# Patient Record
Sex: Male | Born: 1970 | Race: Black or African American | Hispanic: No | Marital: Married | Smoking: Current every day smoker
Health system: Southern US, Community
[De-identification: ages and names within clinical notes are randomized; demographics above are authoritative.]

## PROBLEM LIST (undated history)

## (undated) DIAGNOSIS — I10 Essential (primary) hypertension: Secondary | ICD-10-CM

## (undated) DIAGNOSIS — F1910 Other psychoactive substance abuse, uncomplicated: Secondary | ICD-10-CM

---

## 2014-03-27 ENCOUNTER — Encounter (HOSPITAL_COMMUNITY): Payer: Self-pay | Admitting: Emergency Medicine

## 2014-03-27 ENCOUNTER — Emergency Department (HOSPITAL_COMMUNITY)
Admission: EM | Admit: 2014-03-27 | Discharge: 2014-03-27 | Disposition: A | Discharge status: Home / Self Care | Payer: Self-pay | Attending: Emergency Medicine | Admitting: Emergency Medicine

## 2014-03-27 ENCOUNTER — Emergency Department (HOSPITAL_COMMUNITY): Payer: Self-pay

## 2014-03-27 DIAGNOSIS — F17200 Nicotine dependence, unspecified, uncomplicated: Secondary | ICD-10-CM | POA: Insufficient documentation

## 2014-03-27 DIAGNOSIS — Z23 Encounter for immunization: Secondary | ICD-10-CM | POA: Insufficient documentation

## 2014-03-27 DIAGNOSIS — S41109A Unspecified open wound of unspecified upper arm, initial encounter: Secondary | ICD-10-CM | POA: Insufficient documentation

## 2014-03-27 DIAGNOSIS — IMO0002 Reserved for concepts with insufficient information to code with codable children: Secondary | ICD-10-CM

## 2014-03-27 DIAGNOSIS — Y9389 Activity, other specified: Secondary | ICD-10-CM | POA: Insufficient documentation

## 2014-03-27 DIAGNOSIS — W138XXA Fall from, out of or through other building or structure, initial encounter: Secondary | ICD-10-CM | POA: Insufficient documentation

## 2014-03-27 DIAGNOSIS — Y92009 Unspecified place in unspecified non-institutional (private) residence as the place of occurrence of the external cause: Secondary | ICD-10-CM | POA: Insufficient documentation

## 2014-03-27 MED ORDER — HYDROCODONE-ACETAMINOPHEN 5-325 MG PO TABS: 1.0000 | ORAL_TABLET | ORAL | Status: DC | PRN

## 2014-03-27 MED ORDER — LIDOCAINE-EPINEPHRINE 2 %-1:100000 IJ SOLN
20.0000 mL | Freq: Once | INTRAMUSCULAR | Status: AC
  Administered 2014-03-27: 20 mL
  Filled 2014-03-27: qty 1

## 2014-03-27 MED ORDER — CEPHALEXIN 500 MG PO CAPS: 500.0000 mg | ORAL_CAPSULE | Freq: Four times a day (QID) | ORAL | Status: DC

## 2014-03-27 MED ORDER — TETANUS-DIPHTH-ACELL PERTUSSIS 5-2.5-18.5 LF-MCG/0.5 IM SUSP
0.5000 mL | Freq: Once | INTRAMUSCULAR | Status: AC
  Administered 2014-03-27: 0.5 mL via INTRAMUSCULAR
  Filled 2014-03-27: qty 0.5

## 2014-03-27 MED ORDER — MORPHINE SULFATE 4 MG/ML IJ SOLN
4.0000 mg | Freq: Once | INTRAMUSCULAR | Status: AC
  Administered 2014-03-27: 4 mg via INTRAMUSCULAR
  Filled 2014-03-27: qty 1

## 2014-03-27 MED ORDER — ONDANSETRON 4 MG PO TBDP
4.0000 mg | ORAL_TABLET | Freq: Once | ORAL | Status: AC
  Administered 2014-03-27: 4 mg via ORAL
  Filled 2014-03-27: qty 1

## 2014-03-27 NOTE — ED Notes (Addendum)
Lidocaine at bedside and suture cart

## 2014-03-27 NOTE — ED Provider Notes (Signed)
CSN: [id number]     Arrival date & time 03/27/14  0603 History   First MD Initiated Contact with Patient 03/27/14 0617     Chief Complaint  Patient presents with  . Extremity Laceration    right arm     (Consider location/radiation/quality/duration/timing/severity/associated sxs/prior Treatment) HPI  Patient to the ED with complaints of laceration to right upper arm. He reports the incident happened at 3am this morning after he fell off of his porch. He denies that he was intoxicated and denies that he was stabbed or assaulted. He denies hitting his head or injuring his neck. He presented a few hours late because he waited for his mother to get home, he thought she would be able to fix it but she did not have the materials to do it at home. He has no other complaints of pain or injuries. He has a small cut to his finger but reports not wanting it evaluated " I don't like hospitals". Wound is no longer bleeding  History reviewed. No pertinent past medical history. History reviewed. No pertinent past surgical history. No family history on file. History  Substance Use Topics  . Smoking status: Current Every Day Smoker -- 1.00 packs/day    Types: Cigarettes  . Smokeless tobacco: Not on file  . Alcohol Use: No    Review of Systems  Review of Systems  Gen: no weight loss, fevers, chills, night sweats  Eyes: no occular draining, occular pain,  No visual changes  Nose: no epistaxis or rhinorrhea  Mouth: no dental pain, no sore throat  Neck: no neck pain  Lungs: No hemoptysis. No wheezing or coughing CV:  No palpitations, dependent edema or orthopnea. No chest pain Abd: no diarrhea. No nausea or vomiting, No abdominal pain  GU: no dysuria or gross hematuria  MSK:  No muscle weakness, No muscular pain Neuro: no headache, no focal neurologic deficits  Skin: no rash , + wounds Psyche: no complaints of depression or anxiety   Allergies  Review of patient's allergies indicates no known  allergies.  Home Medications   Prior to Admission medications   Medication Sig Start Date End Date Taking? Authorizing Provider  cephALEXin (KEFLEX) 500 MG capsule Take 1 capsule (500 mg total) by mouth 4 (four) times daily. 03/27/14   Thekla Colborn Irine Seal, PA-C  HYDROcodone-acetaminophen (NORCO/VICODIN) 5-325 MG per tablet Take 1-2 tablets by mouth every 4 (four) hours as needed for moderate pain or severe pain. 03/27/14   Diondre Pulis Irine Seal, PA-C   BP 135/82  Pulse 75  Temp(Src) 97.8 F (36.6 C) (Oral)  Resp 18  Ht  (1.676 m)  Wt 160 lb (72.576 kg)  BMI 25.84 kg/m2  SpO2 100% Physical Exam  Nursing note and vitals reviewed. Constitutional: He appears well-developed and well-nourished. No distress.  HENT:  Head: Normocephalic and atraumatic.  Eyes: Pupils are equal, round, and reactive to light.  Neck: Normal range of motion. Neck supple.  Cardiovascular: Normal rate and regular rhythm.   Pulmonary/Chest: Effort normal.  Abdominal: Soft.  Musculoskeletal:  Large laceration to posterior tricep. 3 inch length, gapping.  Wound explored and does not extend into the fascia. It does extend through the entire subcutaneous layer of fat. He has full strength and sensation to all 5 fingers, wrist, elbow and shoulder joints. CR < 2 seconds. Mild tenderness to palpation.  Neurological: He is alert.  Skin: Skin is warm and dry.     ED Course  Procedures (including critical care  time) Labs Review Labs Reviewed - No data to display  Imaging Review Dg Humerus Right  03/27/2014   CLINICAL DATA:  Fall with laceration to the posterior right humerus.  EXAM: RIGHT HUMERUS - 2+ VIEW  COMPARISON:  None.  FINDINGS: Soft tissue defect demonstrated along the lateral mid right upper arm consistent with history laceration. No radiopaque soft tissue foreign bodies are demonstrated. The right humerus appears intact. No evidence of acute fracture or bone destruction.  IMPRESSION: Soft tissue laceration. No  radiopaque soft tissue foreign bodies. No acute bony abnormalities.   Electronically Signed   By: Burman Nieves M.D.   On: 03/27/2014 06:38     EKG Interpretation None      MDM   Final diagnoses:  Laceration   LACERATION REPAIR Performed by: Dorthula Matas Authorized by: Dorthula Matas Consent: Verbal consent obtained. Risks and benefits: risks, benefits and alternatives were discussed Consent given by: patient Patient identity confirmed: provided demographic data Prepped and Draped in normal sterile fashion Wound explored  Laceration Location: right upper extremity  Laceration Length: 3 inches  No Foreign Bodies seen or palpated  Anesthesia: local infiltration  Local anesthetic: lidocaine 2% with epinephrine  Anesthetic total: 6 ml  Irrigation method: syringe Amount of cleaning: standard  Skin closure: subq ; staples  Number of sutures: 3 ; 8  Technique: horizontal mattress ; staples  Patient tolerance: Patient tolerated the procedure well with no immediate complications.  Pt given tetanus shot in the ED, pain medication and started on Keflex. Advised to return to the ED in or Urgent Care in 7-10 days for staple removal..  Pt again declines assault, stabbing, intoxication, head pain, loc, neck pain or any other injuries or pain. A second time declines his finger being evaluated. Mom is here and fine with his decision. He does not appear intoxicated and to be thinking clearly.  43 y.o.Ricardo Hale's evaluation in the Emergency Department is complete. It has been determined that no acute conditions requiring further emergency intervention are present at this time. The patient/guardian have been advised of the diagnosis and plan. We have discussed signs and symptoms that warrant return to the ED, such as changes or worsening in symptoms.  Vital signs are stable at discharge. Filed Vitals:   03/27/14 0653  BP: 135/82  Pulse: 75  Temp: 97.8 F (36.6 C)   Resp: 18    Patient/guardian has voiced understanding and agreed to follow-up with the PCP or specialist.    Dorthula Matas, PA-C 03/27/14 1610

## 2014-03-27 NOTE — ED Notes (Signed)
PA at bedside.

## 2014-03-27 NOTE — ED Provider Notes (Signed)
Medical screening examination/treatment/procedure(s) were performed by non-physician practitioner and as supervising physician I was immediately available for consultation/collaboration.   EKG Interpretation None       Rani Sisney K Tavian Callander-Rasch, MD 03/27/14 [phone]

## 2014-03-27 NOTE — ED Notes (Addendum)
Patient reports he fell from his balcony at home and cut his arm. Patient is unsure what he cut his arm on. Patient has open wound to right upper arm, @ 2.5"-3" in length. Patient believe his tetanus shot was > 5 years ago. Patient states he cleaned the wound with peroxide and applied neosporin.

## 2014-03-27 NOTE — Discharge Instructions (Signed)

## 2017-08-27 ENCOUNTER — Emergency Department (HOSPITAL_COMMUNITY)
Admission: EM | Admit: 2017-08-27 | Discharge: 2017-08-27 | Disposition: A | Discharge status: Home / Self Care | Payer: Self-pay | Attending: Emergency Medicine | Admitting: Emergency Medicine

## 2017-08-27 ENCOUNTER — Other Ambulatory Visit: Payer: Self-pay

## 2017-08-27 ENCOUNTER — Encounter (HOSPITAL_COMMUNITY): Payer: Self-pay | Admitting: Emergency Medicine

## 2017-08-27 DIAGNOSIS — F1910 Other psychoactive substance abuse, uncomplicated: Secondary | ICD-10-CM | POA: Insufficient documentation

## 2017-08-27 DIAGNOSIS — F17210 Nicotine dependence, cigarettes, uncomplicated: Secondary | ICD-10-CM | POA: Insufficient documentation

## 2017-08-27 DIAGNOSIS — T50901A Poisoning by unspecified drugs, medicaments and biological substances, accidental (unintentional), initial encounter: Secondary | ICD-10-CM | POA: Insufficient documentation

## 2017-08-27 LAB — CBC
HCT: 42.9 % (ref 39.0–52.0)
HEMOGLOBIN: 14.4 g/dL (ref 13.0–17.0)
MCH: 30 pg (ref 26.0–34.0)
MCHC: 33.6 g/dL (ref 30.0–36.0)
MCV: 89.4 fL (ref 78.0–100.0)
Platelets: 292 10*3/uL (ref 150–400)
RBC: 4.8 MIL/uL (ref 4.22–5.81)
RDW: 13.9 % (ref 11.5–15.5)
WBC: 16.8 10*3/uL — ABNORMAL HIGH (ref 4.0–10.5)

## 2017-08-27 LAB — COMPREHENSIVE METABOLIC PANEL
ALK PHOS: 63 U/L (ref 38–126)
ALT: 17 U/L (ref 17–63)
ANION GAP: 7 (ref 5–15)
AST: 23 U/L (ref 15–41)
Albumin: 4.2 g/dL (ref 3.5–5.0)
BUN: 14 mg/dL (ref 6–20)
CALCIUM: 9.1 mg/dL (ref 8.9–10.3)
CO2: 28 mmol/L (ref 22–32)
Chloride: 107 mmol/L (ref 101–111)
Creatinine, Ser: 1.34 mg/dL — ABNORMAL HIGH (ref 0.61–1.24)
GFR calc non Af Amer: >60 mL/min (ref >60)
Glucose, Bld: 113 mg/dL — ABNORMAL HIGH (ref 65–99)
Potassium: 4.1 mmol/L (ref 3.5–5.1)
SODIUM: 142 mmol/L (ref 135–145)
Total Bilirubin: 0.6 mg/dL (ref 0.3–1.2)
Total Protein: 6.8 g/dL (ref 6.5–8.1)

## 2017-08-27 LAB — CBG MONITORING, ED: Glucose-Capillary: 87 mg/dL (ref 65–99)

## 2017-08-27 LAB — RAPID URINE DRUG SCREEN, HOSP PERFORMED
Amphetamines: NOT DETECTED
Barbiturates: NOT DETECTED
Benzodiazepines: POSITIVE — AB
COCAINE: NOT DETECTED
Opiates: POSITIVE — AB
Tetrahydrocannabinol: NOT DETECTED

## 2017-08-27 LAB — ETHANOL: Alcohol, Ethyl (B): <10 mg/dL (ref <10)

## 2017-08-27 LAB — ACETAMINOPHEN LEVEL

## 2017-08-27 LAB — SALICYLATE LEVEL: Salicylate Lvl: <7 mg/dL (ref 2.8–30.0)

## 2017-08-27 NOTE — ED Triage Notes (Signed)
Per EMS pt returned from getting cigarettes around 1600; significant other then found him unresponsive. Pt hx of heroin use. Pt given 2 mg of narcan IM by significant other and additional 2 mg of narcan IV with EMS. Pt has NPA in place and 15 lpm [place] [facility].

## 2017-08-27 NOTE — ED Notes (Signed)
Patient declined discharge papers. Patient given paper scrub top, socks and sandals.

## 2017-08-27 NOTE — ED Notes (Signed)
Patient found attempting to leave ER with IV in hand. Patient redirected to room. EDP made aware, will come to bedside.

## 2017-08-27 NOTE — Progress Notes (Signed)
In and out 14 FR cath to obtain urine from unresponsive patient.  amber urine returned. Pt tolerated without issue.

## 2017-08-27 NOTE — ED Notes (Signed)
Patient is sleeping and and when trying to wake patient to obtain urine sample patient stated that he is fine and wants to leave. Then patient proceeds to fall back alseep before this tech can explain we need urine sample.

## 2017-08-27 NOTE — ED Notes (Signed)
EDP at bedside  

## 2017-08-27 NOTE — ED Provider Notes (Signed)
[facility]-EMERGENCY DEPT Provider Note   CSN: [id number] Arrival date & time: 08/27/17  1648     History   Chief Complaint Chief Complaint  Patient presents with  . Unresponsive    HPI Ricardo Hale is a 47 y.o. male.  47 yo M with a chief complaint of being found unresponsive.  Patient told me that he took quite a few Xanax tablets.  He did this earlier today.  Denied suicidality.  He denied any narcotic use.  Denied any other illegal drug use.  Denies alcohol use.  Patient had significant improvement with Narcan in route to the hospital.  He denies recent illness.  Denies chest pain or shortness of breath.  Denies abdominal pain nausea vomiting or diarrhea.   The history is provided by the patient.  Illness  This is a new problem. The current episode started 3 to 5 hours ago. The problem occurs constantly. The problem has not changed since onset.Pertinent negatives include no chest pain, no abdominal pain, no headaches and no shortness of breath. Nothing aggravates the symptoms. Nothing relieves the symptoms. He has tried nothing for the symptoms. The treatment provided no relief.    History reviewed. No pertinent past medical history.  There are no active problems to display for this patient.   History reviewed. No pertinent surgical history.     Home Medications    Prior to Admission medications   Medication Sig Start Date End Date Taking? Authorizing Provider  cephALEXin (KEFLEX) 500 MG capsule Take 1 capsule (500 mg total) by mouth 4 (four) times daily. 03/27/14   Marlon Pel, PA-C  HYDROcodone-acetaminophen (NORCO/VICODIN) 5-325 MG per tablet Take 1-2 tablets by mouth every 4 (four) hours as needed for moderate pain or severe pain. 03/27/14   Marlon Pel, PA-C    Family History No family history on file.  Social History Social History   Tobacco Use  . Smoking status: Current Every Day Smoker    Packs/day: 1.00    Types:  Cigarettes  Substance Use Topics  . Alcohol use: No  . Drug use: No     Allergies   Patient has no known allergies.   Review of Systems Review of Systems  Constitutional: Negative for chills and fever.  HENT: Negative for congestion and facial swelling.   Eyes: Negative for discharge and visual disturbance.  Respiratory: Negative for shortness of breath.   Cardiovascular: Negative for chest pain and palpitations.  Gastrointestinal: Negative for abdominal pain, diarrhea and vomiting.  Musculoskeletal: Negative for arthralgias and myalgias.  Skin: Negative for color change and rash.  Neurological: Negative for tremors, syncope and headaches.  Psychiatric/Behavioral: Negative for confusion and dysphoric mood.     Physical Exam Updated Vital Signs BP 123/90   Pulse 86   Temp 98.4 F (36.9 C) (Oral)   Resp 17   SpO2 98%   Physical Exam  Constitutional: He is oriented to person, place, and time. He appears well-developed and well-nourished.  HENT:  Head: Normocephalic and atraumatic.  Nasal trumpet in the right naris  Eyes: EOM are normal. Pupils are equal, round, and reactive to light.  Neck: Normal range of motion. Neck supple. No JVD present.  Cardiovascular: Normal rate and regular rhythm. Exam reveals no gallop and no friction rub.  No murmur heard. Pulmonary/Chest: No respiratory distress. He has no wheezes.  Abdominal: He exhibits no distension and no mass. There is no tenderness. There is no rebound and no guarding.  Musculoskeletal: Normal range  of motion.  Neurological: He is alert and oriented to person, place, and time.  Skin: No rash noted. No pallor.  Psychiatric: He has a normal mood and affect. His behavior is normal.  Nursing note and vitals reviewed.    ED Treatments / Results  Labs (all labs ordered are listed, but only abnormal results are displayed) Labs Reviewed  COMPREHENSIVE METABOLIC PANEL - Abnormal; Notable for the following components:        Result Value   Glucose, Bld 113 (*)    Creatinine, Ser 1.34 (*)    All other components within normal limits  ACETAMINOPHEN LEVEL - Abnormal; Notable for the following components:   Acetaminophen (Tylenol), Serum <10 (*)    All other components within normal limits  CBC - Abnormal; Notable for the following components:   WBC 16.8 (*)    All other components within normal limits  RAPID URINE DRUG SCREEN, HOSP PERFORMED - Abnormal; Notable for the following components:   Opiates POSITIVE (*)    Benzodiazepines POSITIVE (*)    All other components within normal limits  ETHANOL  SALICYLATE LEVEL  CBG MONITORING, ED    EKG  EKG Interpretation  Date/Time:  Thursday August 27 2017 17:05:13 EST Ventricular Rate:  85 PR Interval:    QRS Duration: 90 QT Interval:  350 QTC Calculation: 417 R Axis:   25 Text Interpretation:  Sinus rhythm No old tracing to compare Confirmed by Melene PlanFloyd, Ozzie Remmers [phone]([id number]) on 08/27/2017 5:22:10 PM       Radiology No results found.  Procedures Procedures (including critical care time)  Medications Ordered in ED Medications - No data to display   Initial Impression / Assessment and Plan / ED Course  I have reviewed the triage vital signs and the nursing notes.  Pertinent labs & imaging results that were available during my care of the patient were reviewed by me and considered in my medical decision making (see chart for details).     47 yo M with an unintentional overdose.  Will observe in the emergency department.  Patient is now ambulating independently.  He denies suicidal or homicidal ideation.  Patient is requesting discharge home. Given outpatient resources.  10:26 PM:  I have discussed the diagnosis/risks/treatment options with the patient and believe the pt to be eligible for discharge home to follow-up with PCP. We also discussed returning to the ED immediately if new or worsening sx occur. We discussed the sx which are most concerning  (e.g., sudden worsening pain, fever, inability to tolerate by mouth) that necessitate immediate return. Medications administered to the patient during their visit and any new prescriptions provided to the patient are listed below.  Medications given during this visit Medications - No data to display   The patient appears reasonably screen and/or stabilized for discharge and I doubt any other medical condition or other [facility] requiring further screening, evaluation, or treatment in the ED at this time prior to discharge.    Final Clinical Impressions(s) / ED Diagnoses   Final diagnoses:  Accidental drug overdose, initial encounter  Substance abuse [facility](HCC)    ED Discharge Orders    None       Melene PlanFloyd, Sobia Karger, DO 08/27/17 2227

## 2017-08-29 ENCOUNTER — Emergency Department (HOSPITAL_COMMUNITY): Payer: Self-pay

## 2017-08-29 ENCOUNTER — Inpatient Hospital Stay (HOSPITAL_COMMUNITY)
Admission: EM | Admit: 2017-08-29 | Discharge: 2017-08-30 | DRG: 917 | Discharge status: Against Medical Advice | Payer: Self-pay | Attending: Pulmonary Disease | Admitting: Pulmonary Disease

## 2017-08-29 ENCOUNTER — Encounter (HOSPITAL_COMMUNITY): Payer: Self-pay | Admitting: Emergency Medicine

## 2017-08-29 DIAGNOSIS — J690 Pneumonitis due to inhalation of food and vomit: Secondary | ICD-10-CM | POA: Diagnosis present

## 2017-08-29 DIAGNOSIS — J9601 Acute respiratory failure with hypoxia: Secondary | ICD-10-CM

## 2017-08-29 DIAGNOSIS — R092 Respiratory arrest: Secondary | ICD-10-CM | POA: Diagnosis present

## 2017-08-29 DIAGNOSIS — R911 Solitary pulmonary nodule: Secondary | ICD-10-CM | POA: Diagnosis present

## 2017-08-29 DIAGNOSIS — N179 Acute kidney failure, unspecified: Secondary | ICD-10-CM | POA: Diagnosis present

## 2017-08-29 DIAGNOSIS — R0902 Hypoxemia: Secondary | ICD-10-CM | POA: Diagnosis present

## 2017-08-29 DIAGNOSIS — F17210 Nicotine dependence, cigarettes, uncomplicated: Secondary | ICD-10-CM | POA: Diagnosis present

## 2017-08-29 DIAGNOSIS — T401X1A Poisoning by heroin, accidental (unintentional), initial encounter: Principal | ICD-10-CM | POA: Diagnosis present

## 2017-08-29 LAB — COMPREHENSIVE METABOLIC PANEL
ALT: 24 U/L (ref 17–63)
ANION GAP: 7 (ref 5–15)
AST: 50 U/L — ABNORMAL HIGH (ref 15–41)
Albumin: 4.2 g/dL (ref 3.5–5.0)
Alkaline Phosphatase: 55 U/L (ref 38–126)
BUN: 20 mg/dL (ref 6–20)
CALCIUM: 9.1 mg/dL (ref 8.9–10.3)
CHLORIDE: 109 mmol/L (ref 101–111)
CO2: 27 mmol/L (ref 22–32)
Creatinine, Ser: 1.12 mg/dL (ref 0.61–1.24)
Glucose, Bld: 97 mg/dL (ref 65–99)
Potassium: 3.9 mmol/L (ref 3.5–5.1)
Sodium: 143 mmol/L (ref 135–145)
Total Bilirubin: 1 mg/dL (ref 0.3–1.2)
Total Protein: 6.8 g/dL (ref 6.5–8.1)

## 2017-08-29 LAB — CBC WITH DIFFERENTIAL/PLATELET
Basophils Absolute: 0.1 10*3/uL (ref 0.0–0.1)
Basophils Relative: 0 %
EOS ABS: 0.6 10*3/uL (ref 0.0–0.7)
EOS PCT: 5 %
HCT: 38.5 % — ABNORMAL LOW (ref 39.0–52.0)
Hemoglobin: 13.1 g/dL (ref 13.0–17.0)
LYMPHS ABS: 2.7 10*3/uL (ref 0.7–4.0)
LYMPHS PCT: 21 %
MCH: 30 pg (ref 26.0–34.0)
MCHC: 34 g/dL (ref 30.0–36.0)
MCV: 88.3 fL (ref 78.0–100.0)
MONO ABS: 1 10*3/uL (ref 0.1–1.0)
MONOS PCT: 8 %
Neutro Abs: 8.3 10*3/uL — ABNORMAL HIGH (ref 1.7–7.7)
Neutrophils Relative %: 66 %
PLATELETS: 292 10*3/uL (ref 150–400)
RBC: 4.36 MIL/uL (ref 4.22–5.81)
RDW: 14.1 % (ref 11.5–15.5)
WBC: 12.6 10*3/uL — ABNORMAL HIGH (ref 4.0–10.5)

## 2017-08-29 LAB — BLOOD GAS, ARTERIAL
ACID-BASE EXCESS: 0.6 mmol/L (ref 0.0–2.0)
Bicarbonate: 25.9 mmol/L (ref 20.0–28.0)
Drawn by: 422461
O2 Content: 2 L/min
O2 Saturation: 96.3 %
PH ART: 7.364 (ref 7.350–7.450)
Patient temperature: 98.1
pCO2 arterial: 46.4 mmHg (ref 32.0–48.0)
pO2, Arterial: 92.2 mmHg (ref 83.0–108.0)

## 2017-08-29 LAB — ETHANOL

## 2017-08-29 LAB — I-STAT CG4 LACTIC ACID, ED
LACTIC ACID, VENOUS: 0.41 mmol/L — AB (ref 0.5–1.9)
Lactic Acid, Venous: 0.69 mmol/L (ref 0.5–1.9)

## 2017-08-29 LAB — I-STAT TROPONIN, ED: Troponin i, poc: 0 ng/mL (ref 0.00–0.08)

## 2017-08-29 LAB — I-STAT CHEM 8, ED
BUN: 19 mg/dL (ref 6–20)
CHLORIDE: 104 mmol/L (ref 101–111)
Calcium, Ion: 1.19 mmol/L (ref 1.15–1.40)
Creatinine, Ser: 1.1 mg/dL (ref 0.61–1.24)
Glucose, Bld: 90 mg/dL (ref 65–99)
HEMATOCRIT: 40 % (ref 39.0–52.0)
Hemoglobin: 13.6 g/dL (ref 13.0–17.0)
Potassium: 3.7 mmol/L (ref 3.5–5.1)
Sodium: 143 mmol/L (ref 135–145)
TCO2: 26 mmol/L (ref 22–32)

## 2017-08-29 MED ORDER — SODIUM CHLORIDE 0.9 % IV SOLN: 250.0000 mL | INTRAVENOUS | Status: DC | PRN

## 2017-08-29 MED ORDER — PANTOPRAZOLE SODIUM 40 MG PO TBEC: 40.0000 mg | DELAYED_RELEASE_TABLET | Freq: Every day | ORAL | Status: DC

## 2017-08-29 MED ORDER — PIPERACILLIN-TAZOBACTAM 3.375 G IVPB 30 MIN
3.3750 g | Freq: Once | INTRAVENOUS | Status: AC
  Administered 2017-08-29: 3.375 g via INTRAVENOUS
  Filled 2017-08-29: qty 50

## 2017-08-29 MED ORDER — SODIUM CHLORIDE 0.9 % IV SOLN
1.0000 g | Freq: Once | INTRAVENOUS | Status: AC
  Administered 2017-08-29: 1 g via INTRAVENOUS
  Filled 2017-08-29: qty 10

## 2017-08-29 MED ORDER — NALOXONE HCL 0.4 MG/ML IJ SOLN
0.4000 mg | Freq: Once | INTRAMUSCULAR | Status: AC
  Administered 2017-08-29: 0.4 mg via INTRAVENOUS
  Filled 2017-08-29: qty 1

## 2017-08-29 MED ORDER — HEPARIN SODIUM (PORCINE) 5000 UNIT/ML IJ SOLN: 5000.0000 [IU] | Freq: Three times a day (TID) | INTRAMUSCULAR | Status: DC

## 2017-08-29 MED ORDER — IPRATROPIUM-ALBUTEROL 0.5-2.5 (3) MG/3ML IN SOLN: 3.0000 mL | Freq: Four times a day (QID) | RESPIRATORY_TRACT | Status: DC

## 2017-08-29 MED ORDER — NALOXONE HCL 4 MG/10ML IJ SOLN
0.2500 mg/h | INTRAMUSCULAR | Status: DC
  Administered 2017-08-29: 0.25 mg/h via INTRAVENOUS
  Filled 2017-08-29: qty 10

## 2017-08-29 MED ORDER — NALOXONE HCL 0.4 MG/ML IJ SOLN
INTRAMUSCULAR | Status: AC
  Filled 2017-08-29: qty 1

## 2017-08-29 MED ORDER — AZITHROMYCIN 500 MG IV SOLR
500.0000 mg | Freq: Once | INTRAVENOUS | Status: AC
  Administered 2017-08-29: 500 mg via INTRAVENOUS
  Filled 2017-08-29: qty 500

## 2017-08-29 MED ORDER — NALOXONE HCL 0.4 MG/ML IJ SOLN
0.2000 mg | Freq: Once | INTRAMUSCULAR | Status: AC
  Administered 2017-08-29: 0.2 mg via INTRAVENOUS

## 2017-08-29 MED ORDER — NALOXONE HCL 0.4 MG/ML IJ SOLN
0.1000 mg | Freq: Once | INTRAMUSCULAR | Status: AC
  Administered 2017-08-29: 0.1 mg via INTRAVENOUS
  Filled 2017-08-29: qty 1

## 2017-08-29 NOTE — ED Notes (Signed)
Patient noted to be apneic, snoring respirations with O2 86% on 2L [place]. EDP made aware. Verbal order to raise O2 to 4L and meds to be ordered.

## 2017-08-29 NOTE — H&P (Signed)
HISTORY & PHYSICAL  Patient Name: Ricardo Hale MRN: [id number] DOB: January 29, 1971    ADMISSION DATE:  08/29/2017 DATE OF SERVICE: 08/29/2017  REFERRING MD: Dr. Tilden FossaElizabeth Rees  CHIEF COMPLAINT:  Heroin overdose   HISTORY OF PRESENT ILLNESS  This 47 y.o. African-American male smoker [facility] via EMS with complaints of altered mental status and respiratory arrest.  According to available documentation, the patient collapsed in the presence of police officers, who will responding to a call of domestic dispute.  The patient was observed to be walking around and agitated but suddenly experienced respiratory arrest.  When EMS arrived, the patient was observed to demonstrate minimal respiratory effort.  He responded promptly to Narcan 1 mg single dose IV.  The patient admits to using heroin regularly.  In the emergency department, the patient has been managed with Narcan infusion.  At the time of clinical interview, the patient is easily arousable.  He is awake, alert and oriented to person and place.  He thinks it is 2017 but identifies "[name]" as POTUS.   REVIEW OF SYSTEMS Constitutional: No weight loss. No night sweats. No fever. No chills. No fatigue. HEENT: No headaches, dysphagia, sore throat, otalgia, nasal congestion, PND. CV:  No chest pain, orthopnea, PND, swelling in lower extremities, palpitations GI:  No abdominal pain, nausea, vomiting, diarrhea, change in bowel pattern, anorexia Resp: No DOE, rest dyspnea, cough, mucus, hemoptysis, wheezing  GU: no dysuria, change in color of urine, no urgency or frequency.  No flank pain. MS:  No joint pain or swelling. No myalgias,  No decreased range of motion.  Psych:  No change in mood or affect. No memory loss. Skin: no rash or lesions.   PAST MEDICAL/SURGICAL/SOCIAL/FAMILY HISTORIES   History reviewed. No pertinent past medical history.  History reviewed. No pertinent  surgical history.  Social History   Tobacco Use  . Smoking status: Current Every Day Smoker    Packs/day: 1.00    Types: Cigarettes  . Smokeless tobacco: Never Used  Substance Use Topics  . Alcohol use: No    No family history on file.   No Known Allergies   Prior to Admission medications   Medication Sig Start Date End Date Taking? Authorizing Provider  cephALEXin (KEFLEX) 500 MG capsule Take 1 capsule (500 mg total) by mouth 4 (four) times daily. 03/27/14   Marlon PelGreene, Tiffany, PA-C  HYDROcodone-acetaminophen (NORCO/VICODIN) 5-325 MG per tablet Take 1-2 tablets by mouth every 4 (four) hours as needed for moderate pain or severe pain. 03/27/14   Marlon PelGreene, Tiffany, PA-C    Current Facility-Administered Medications  Medication Dose Route Frequency Provider Last Rate Last Dose  . naloxone HCl (NARCAN) 4 mg in dextrose 5 % 250 mL infusion  0.25 mg/hr Intravenous Continuous Tilden Fossaees, Elizabeth, MD 15.6 mL/hr at 08/29/17 2034 0.25 mg/hr at 08/29/17 2034   Current Outpatient Medications  Medication Sig Dispense Refill  . cephALEXin (KEFLEX) 500 MG capsule Take 1 capsule (500 mg total) by mouth 4 (four) times daily. 28 capsule 0  . HYDROcodone-acetaminophen (NORCO/VICODIN) 5-325 MG per tablet Take 1-2 tablets by mouth every 4 (four) hours as needed for moderate pain or severe pain. 12 tablet 0    VITAL SIGNS: BP 111/88   Pulse 83   Temp 98.1 F (36.7 C) (Oral)   Resp 13   SpO2 98%   INTAKE / OUTPUT: No intake/output data recorded.  PHYSICAL EXAMINATION: GENERAL: Lethargic, easily arousable.  Oriented to  person and place.  No acute distress.  On room air.  SpO2 96% with nasal cannula running into the pillow. HEAD: normocephalic, atraumatic EYE: PERRLA, EOM intact, no scleral icterus, no pallor. NOSE: nares are patent. No polyps. No exudate. No sinus tenderness. THROAT/ORAL CAVITY: Normal dentition. No oral thrush. No exudate. Mucous membranes are moist. No tonsillar enlargement.   Mallampati class III airway. NECK: supple, no thyromegaly, no JVD, no lymphadenopathy. Trachea midline. CHEST/LUNG: symmetric in development and expansion. Good air entry.  Scattered rales and rhonchi. HEART: Regular S1 and S2 without murmur, rub or gallop. ABDOMEN: soft, nontender, nondistended. Normoactive bowel sounds. No rebound. No guarding. No hepatosplenomegaly. EXTREMITIES: Edema: Right hand edema, where his Narcan drip is infusing.  Otherwise no edema at the extremities. No cyanosis.  No clubbing. 2+ DP pulses LYMPHATIC: no cervical/axiallary/inguinal lymph nodes appreciated MUSCULOSKELETAL: no joint tenderness, deformity or swelling. SKIN: Multiple tattoos.  No rash or lesion. NEUROLOGIC: Doll's eyes intact. Corneal reflex intact. Spontaneous respirations intact. Cranial nerves II-XII are grossly symmetric and physiologic. Babinski absent. No sensory deficit. Motor: 5/5 @ RUE, 5/5 @ LUE, 5/5 @ RLL,  5/5 @ LLL.  DTR: 2+ @ R biceps, 2+ @ L biceps, 2+ @ R patellar,  2+ @ L patellar. No cerebellar signs. Gait was not assessed.   LABS:  BASIC METABOLIC PROFILE Recent Labs  Lab 08/27/17 1707 08/29/17 1846 08/29/17 1909  NA 142 143 143  K 4.1 3.7 3.9  CL 107 104 109  CO2 28  --  27  BUN 14 19 20   CREATININE 1.34* 1.10 1.12  GLUCOSE 113* 90 97  CALCIUM 9.1  --  9.1    Glucose Recent Labs  Lab 08/27/17 1716  GLUCAP 87    Liver Enzymes Recent Labs  Lab 08/27/17 1707 08/29/17 1909  AST 23 50*  ALT 17 24  ALKPHOS 63 55  BILITOT 0.6 1.0  ALBUMIN 4.2 4.2    CBC Recent Labs  Lab 08/27/17 1707 08/29/17 1846 08/29/17 1909  WBC 16.8*  --  12.6*  HGB 14.4 13.6 13.1  HCT 42.9 40.0 38.5*  PLT 292  --  292    COAGULATION STUDIES No results for input(s): APTT, INR in the last 168 hours.  SEPSIS MARKERS Recent Labs  Lab 08/29/17 1925 08/29/17 2102  LATICACIDVEN 0.69 0.41*    ABG No results for input(s): PHART, PCO2ART, PO2ART in the last 168  hours.  Cardiac Enzymes No results for input(s): TROPONINI, PROBNP in the last 168 hours.  Imaging Dg Chest Port 1 View  Result Date: 08/29/2017 CLINICAL DATA:  Patient with recent drug overdose. EXAM: PORTABLE CHEST 1 VIEW COMPARISON:  None. FINDINGS: Monitoring leads overlie the patient. Cardiac contours upper limits of normal. Low lung volumes. Consolidative opacities throughout the left lung. Nodular versus consolidative opacity right upper lung. No pleural effusion or pneumothorax. IMPRESSION: Heterogeneous opacities left lung may represent asymmetric pulmonary edema or atypical infection. Nodular consolidative opacity right upper hemithorax may represent pulmonary nodule versus infection. Recommend follow-up chest radiograph in 4-6 weeks if not performed earlier. If findings persist, chest CT would be warranted. Electronically Signed   By: Annia Belt M.D.   On: 08/29/2017 19:00   CULTURES: No results found for this or any previous visit.  ANTIBIOTICS: Rocephin/azithromycin (single doses, 08/29/2017) Zosyn (3/2-)   ASSESSMENT / PLAN: Principal Problem:   Respiratory arrest (HCC) Active Problems:   Heroin overdose (HCC)   Aspiration pneumonia (HCC)   Pulmonary nodule   Acute kidney  injury [facility])   Heroin overdose, accidental or unintentional, initial encounter (HCC)   By systems: PULMONARY  Abnormal chest x-ray  Aspiration pneumonia  Pulmonary nodule, right upper lobe Check ABG in 1 hour. Check procalcitonin. Empiric Zosyn. Repeat chest x-ray in 6 weeks.  Persistence of the right upper lobe abnormality would warrant chest CT imaging.  RENAL  Acute kidney injury IV fluids  NEUROLOGIC  Heroin overdose Narcan gtt.  Titrate for RASS 0 to -1.  Admit to ICU under my service (Attending: Marcelle Smiling, MD) with the diagnoses highlighted above in the active Hospital Problem List (ASSESSMENT). IV fluids: Normal saline @75  cc/h.  NUTRITION: Regular diet DVT  PROPHYLAXIS: Heparin GI PROPHYLAXIS: Protonix  FAMILY  - Updates: No family at the bedside   Marcelle Smiling, MD Board Certified by the ABIM, Pulmonary Diseases & Critical Care Medicine  [facility] Pager: [phone]  08/29/2017, 10:23 PM

## 2017-08-29 NOTE — ED Triage Notes (Signed)
[employer] called d/t pt being in a domestic dispute. Pt was found walking around agitated. [employer] sts that pt just collapsed. When EMS arrived, pt was apneic with minimal respiratory effort. Pt was given 1 mg Narcan IV. Pt became responsive within 1 minute. Pt was alert and oriented en route. Pt reported to EMS that he snorted "a line of heroin." Pt sts he believes that it was "laced with something."

## 2017-08-29 NOTE — Progress Notes (Signed)
A consult was received from an ED physician for zosyn per pharmacy dosing.  The patient's profile has been reviewed for ht/wt/allergies/indication/available labs.   A one time order has been placed for zosyn 3.375gm iv x1.  Further antibiotics/pharmacy consults should be ordered by admitting physician if indicated.                       Thank you, Ricardo DavidsonGrimsley Hale, Ricardo Hale 08/29/2017  10:45 PM

## 2017-08-29 NOTE — ED Provider Notes (Signed)
[facility]-EMERGENCY DEPT Provider Note   CSN: [id number] Arrival date & time: 08/29/17  1805     History   Chief Complaint Chief Complaint  Patient presents with  . Drug Overdose    HPI Ricardo Hale is a 47 y.o. male.  The history is provided by the patient and the EMS personnel. No language interpreter was used.  Drug Overdose     Ricardo Hale is a 47 y.o. male who presents to the Emergency Department complaining of overdose.  Level V caveat due to AMS.  Per report [employer] was called for domestic dispute and patient was walking around agitated and then collapsed and apneic.  He was given 1 mg of Narcan IV and became responsive.  Patient states that he snorted a line of heroin and thinks it may have been laced with something.  He reports using heroin 3-5 times a month.  He has no current complaints.  He denies any SI, HI. History reviewed. No pertinent past medical history.  Patient Active Problem List   Diagnosis Date Noted  . Heroin overdose (HCC) 08/29/2017  . Respiratory arrest (HCC) 08/29/2017  . Aspiration pneumonia (HCC) 08/29/2017  . Pulmonary nodule 08/29/2017  . Acute kidney injury (HCC) 08/29/2017  . Heroin overdose, accidental or unintentional, initial encounter (HCC) 08/29/2017    History reviewed. No pertinent surgical history.     Home Medications    Prior to Admission medications   Not on File    Family History History reviewed. No pertinent family history.  Social History Social History   Tobacco Use  . Smoking status: Current Every Day Smoker    Packs/day: 1.00    Types: Cigarettes  . Smokeless tobacco: Never Used  Substance Use Topics  . Alcohol use: No  . Drug use: No     Allergies   Patient has no known allergies.   Review of Systems Review of Systems  All other systems reviewed and are negative.    Physical Exam Updated Vital Signs BP (!) 135/101   Pulse 83   Temp 98.1 F (36.7 C) (Oral)   Resp (!)  5   SpO2 96%   Physical Exam  Constitutional: He appears well-developed and well-nourished.  HENT:  Head: Normocephalic and atraumatic.  Nasal trumpet in place  Cardiovascular: Normal rate and regular rhythm.  No murmur heard. Pulmonary/Chest: Effort normal and breath sounds normal. No respiratory distress.  Abdominal: Soft. There is no tenderness. There is no rebound and no guarding.  Musculoskeletal: He exhibits no edema or tenderness.  Neurological:  Drowsy but arousable to verbal stimuli.  Oriented to place and time.  Moves all extremities symmetrically  Skin: Skin is warm and dry.  Psychiatric:  Unable to assess  Nursing note and vitals reviewed.    ED Treatments / Results  Labs (all labs ordered are listed, but only abnormal results are displayed) Labs Reviewed  CBC WITH DIFFERENTIAL/PLATELET - Abnormal; Notable for the following components:      Result Value   WBC 12.6 (*)    HCT 38.5 (*)    Neutro Abs 8.3 (*)    All other components within normal limits  COMPREHENSIVE METABOLIC PANEL - Abnormal; Notable for the following components:   AST 50 (*)    All other components within normal limits  I-STAT CG4 LACTIC ACID, ED - Abnormal; Notable for the following components:   Lactic Acid, Venous 0.41 (*)    All other components within normal limits  CULTURE, RESPIRATORY (  NON-EXPECTORATED)  CULTURE, BLOOD (ROUTINE X 2)  CULTURE, BLOOD (ROUTINE X 2)  ETHANOL  BRAIN NATRIURETIC PEPTIDE  RAPID URINE DRUG SCREEN, HOSP PERFORMED  ACETAMINOPHEN LEVEL  SALICYLATE LEVEL  BASIC METABOLIC PANEL  CBC  MAGNESIUM  PHOSPHORUS  PROCALCITONIN  PROCALCITONIN  HIV ANTIBODY (ROUTINE TESTING)  BLOOD GAS, ARTERIAL  BLOOD GAS, ARTERIAL  I-STAT CHEM 8, ED  I-STAT TROPONIN, ED  I-STAT CG4 LACTIC ACID, ED    EKG  EKG Interpretation  Date/Time:  Saturday August 29 2017 18:25:10 EST Ventricular Rate:  86 PR Interval:    QRS Duration: 86 QT Interval:  360 QTC Calculation: 431 R  Axis:   2 Text Interpretation:  Sinus rhythm Probable left atrial enlargement Confirmed by Tilden Fossa [phone]) on 08/29/2017 6:54:28 PM       Radiology Dg Chest Port 1 View  Result Date: 08/29/2017 CLINICAL DATA:  Patient with recent drug overdose. EXAM: PORTABLE CHEST 1 VIEW COMPARISON:  None. FINDINGS: Monitoring leads overlie the patient. Cardiac contours upper limits of normal. Low lung volumes. Consolidative opacities throughout the left lung. Nodular versus consolidative opacity right upper lung. No pleural effusion or pneumothorax. IMPRESSION: Heterogeneous opacities left lung may represent asymmetric pulmonary edema or atypical infection. Nodular consolidative opacity right upper hemithorax may represent pulmonary nodule versus infection. Recommend follow-up chest radiograph in 4-6 weeks if not performed earlier. If findings persist, chest CT would be warranted. Electronically Signed   By: Annia Belt M.D.   On: 08/29/2017 19:00    Procedures Procedures (including critical care time) CRITICAL CARE Performed by: Tilden Fossa   Total critical care time: 45 minutes  Critical care time was exclusive of separately billable procedures and treating other patients.  Critical care was necessary to treat or prevent imminent or life-threatening deterioration.  Critical care was time spent personally by me on the following activities: development of treatment plan with patient and/or surrogate as well as nursing, discussions with consultants, evaluation of patient's response to treatment, examination of patient, obtaining history from patient or surrogate, ordering and performing treatments and interventions, ordering and review of laboratory studies, ordering and review of radiographic studies, pulse oximetry and re-evaluation of patient's condition.  Medications Ordered in ED Medications  naloxone HCl (NARCAN) 4 mg in dextrose 5 % 250 mL infusion (0 mg/hr Intravenous Stopped 08/29/17 2304)   piperacillin-tazobactam (ZOSYN) IVPB 3.375 g (not administered)  0.9 %  sodium chloride infusion (not administered)  heparin injection 5,000 Units (not administered)  pantoprazole (PROTONIX) EC tablet 40 mg (not administered)  ipratropium-albuterol (DUONEB) 0.5-2.5 (3) MG/3ML nebulizer solution 3 mL (not administered)  naloxone (NARCAN) injection 0.1 mg (0.1 mg Intravenous Given 08/29/17 1840)  naloxone (NARCAN) injection 0.2 mg (0.2 mg Intravenous Given 08/29/17 1911)  naloxone [facility]) injection 0.4 mg (0.4 mg Intravenous Given 08/29/17 1955)  cefTRIAXone (ROCEPHIN) 1 g in sodium chloride 0.9 % 100 mL IVPB (0 g Intravenous Stopped 08/29/17 2032)  azithromycin (ZITHROMAX) 500 mg in sodium chloride 0.9 % 250 mL IVPB (0 mg Intravenous Stopped 08/29/17 2146)     Initial Impression / Assessment and Plan / ED Course  I have reviewed the triage vital signs and the nursing notes.  Pertinent labs & imaging results that were available during my care of the patient were reviewed by me and considered in my medical decision making (see chart for details).    1900 - called to bedside for hypoxia, low EtCO2 - pt somnolent but arousable, RR 12, EtCO20, SpO2 87 %.  On waking  sats improve to 99%.  Will treat with narcan. CXR with infiltrate vs pulmonary edema - pt asymptomatic.  Checking labs.    Patient required multiple Narcan doses for respiratory depression with hypoxia.  He was started on Narcan drip with improvement in his oxygenation and ventilation.  He was treated with antibiotics for possible pneumonia.  [facility] for respiratory failure due to narcotic overdose.  Final Clinical Impressions(s) / ED Diagnoses   Final diagnoses:  None    ED Discharge Orders    None       Tilden Fossaees, Ralphine Hinks, MD 08/29/17 2337

## 2017-08-30 LAB — HIV ANTIBODY (ROUTINE TESTING W REFLEX): HIV Screen 4th Generation wRfx: NONREACTIVE

## 2017-08-30 LAB — RAPID URINE DRUG SCREEN, HOSP PERFORMED
AMPHETAMINES: NOT DETECTED
BARBITURATES: NOT DETECTED
Benzodiazepines: POSITIVE — AB
COCAINE: NOT DETECTED
OPIATES: POSITIVE — AB
TETRAHYDROCANNABINOL: NOT DETECTED

## 2017-08-30 LAB — PROCALCITONIN: Procalcitonin: <0.1 ng/mL

## 2017-08-30 LAB — SALICYLATE LEVEL

## 2017-08-30 LAB — ACETAMINOPHEN LEVEL

## 2017-08-30 NOTE — ED Notes (Signed)
Patient starting to become more somnolent, respiration starting to decrease and patient starting to snore. Narcan drip restarted and EDP notified.

## 2017-08-30 NOTE — ED Notes (Addendum)
This Clinical research associatewriter called into room. Patient had taken off equipment and was getting dressed. Attempted to redirect patient. Patient still wanting to leave. Discussed risks of patient leaving, patient states, "Only God knows." IVs x2 removed. Intensivist paged. Patient refusing to sign for AMA.

## 2017-08-30 NOTE — ED Notes (Signed)
Pt awake and requesting food. Provided with a sandwich and something to drink.

## 2017-08-30 NOTE — ED Notes (Signed)
Called Elink [phone] informed Ricardo [name] the let Dr Ardeth PerfectJeong know pt is leaving and will not stay pulled out his own IV's and took off all his monitoring equipment.

## 2017-08-30 NOTE — ED Notes (Signed)
Patient continually threatening to leave.

## 2017-09-02 ENCOUNTER — Emergency Department (HOSPITAL_COMMUNITY)
Admission: EM | Admit: 2017-09-02 | Discharge: 2017-09-02 | Disposition: A | Discharge status: Home / Self Care | Payer: Self-pay | Attending: Emergency Medicine | Admitting: Emergency Medicine

## 2017-09-02 ENCOUNTER — Other Ambulatory Visit: Payer: Self-pay

## 2017-09-02 ENCOUNTER — Encounter (HOSPITAL_COMMUNITY): Payer: Self-pay

## 2017-09-02 DIAGNOSIS — F17210 Nicotine dependence, cigarettes, uncomplicated: Secondary | ICD-10-CM | POA: Insufficient documentation

## 2017-09-02 DIAGNOSIS — G5632 Lesion of radial nerve, left upper limb: Secondary | ICD-10-CM | POA: Insufficient documentation

## 2017-09-02 DIAGNOSIS — G5702 Lesion of sciatic nerve, left lower limb: Secondary | ICD-10-CM | POA: Insufficient documentation

## 2017-09-02 NOTE — ED Triage Notes (Signed)
He c/o paresthesias of his entire left arm sinc ~ 2200 hours yesterday. He grip is markedly weaker as compared with right. He denies any trauma/illness or pain. His arm drifts down slowly when unattended.

## 2017-09-02 NOTE — ED Notes (Signed)
Ortho tech called regarding splint. It is a L wrist, therefore tech to come switch splint. Splint in room.

## 2017-09-02 NOTE — ED Provider Notes (Signed)
[facility]-EMERGENCY DEPT Provider Note   CSN: [id number] Arrival date & time: 09/02/17  [id number]     History   Chief Complaint Chief Complaint  Patient presents with  . Numbness    HPI Ricardo Hale is a 47 y.o. male.  47 yo M with a significant past medical history of heroin abuse comes in with a chief complaint of left arm paresthesias.  Patient states that he went to sleep and when he woke up he felt like his whole arm was tingling from mid humerus down.  He felt that that arm was weak as well.  He is left-handed.  Denies difficulty with speech or swallowing denies difficulty with ambulation denies difficulty with naming.  Denies neck pain, fever, headache.  Patient is never had an issue like this before.  Started yesterday and he thought it would get better but has not resolved so came to be evaluated.  The patient is also had some left buttock pain.  This been going off and on for the past few weeks.  He denies trauma denies radiation down the leg denies difficulty with bowel or bladder.   The history is provided by the patient.  Illness  This is a new problem. The current episode started yesterday. The problem occurs constantly. The problem has not changed since onset.Pertinent negatives include no chest pain, no abdominal pain, no headaches and no shortness of breath. Nothing aggravates the symptoms. Nothing relieves the symptoms. He has tried nothing for the symptoms.    History reviewed. No pertinent past medical history.  Patient Active Problem List   Diagnosis Date Noted  . Heroin overdose (HCC) 08/29/2017  . Respiratory arrest (HCC) 08/29/2017  . Aspiration pneumonia (HCC) 08/29/2017  . Pulmonary nodule 08/29/2017  . Acute kidney injury (HCC) 08/29/2017  . Heroin overdose, accidental or unintentional, initial encounter (HCC) 08/29/2017    No past surgical history on file.     Home Medications    Prior to Admission medications   Not on File      Family History No family history on file.  Social History Social History   Tobacco Use  . Smoking status: Current Every Day Smoker    Packs/day: 1.00    Types: Cigarettes  . Smokeless tobacco: Never Used  Substance Use Topics  . Alcohol use: No  . Drug use: No     Allergies   Patient has no known allergies.   Review of Systems Review of Systems  Constitutional: Negative for chills and fever.  HENT: Negative for congestion and facial swelling.   Eyes: Negative for discharge and visual disturbance.  Respiratory: Negative for shortness of breath.   Cardiovascular: Negative for chest pain and palpitations.  Gastrointestinal: Negative for abdominal pain, diarrhea and vomiting.  Musculoskeletal: Negative for arthralgias and myalgias.  Skin: Negative for color change and rash.  Neurological: Positive for numbness (tingling). Negative for tremors, syncope and headaches.  Psychiatric/Behavioral: Negative for confusion and dysphoric mood.     Physical Exam Updated Vital Signs BP (!) 141/95 (BP Location: Right Arm)   Pulse 94   Temp 98.9 F (37.2 C) (Oral)   Resp 18   SpO2 100%   Physical Exam  Constitutional: He is oriented to person, place, and time. He appears well-developed and well-nourished.  HENT:  Head: Normocephalic and atraumatic.  Eyes: EOM are normal. Pupils are equal, round, and reactive to light.  Neck: Normal range of motion. Neck supple. No JVD present.  Cardiovascular: Normal rate  and regular rhythm. Exam reveals no gallop and no friction rub.  No murmur heard. Pulmonary/Chest: No respiratory distress. He has no wheezes.  Abdominal: He exhibits no distension and no mass. There is no tenderness. There is no rebound and no guarding.  Musculoskeletal: Normal range of motion.  Neurological: He is alert and oriented to person, place, and time.  Left wrist drop, palsy to the left radial nerve.  Decreased sensation in all nerve distributions.  Motor  intact to all nerve distributions, but weak in the radial nerve distribution.  No midline spinal tenderness, negative Spurling's test.  Skin: No rash noted. No pallor.  Psychiatric: He has a normal mood and affect. His behavior is normal.  Nursing note and vitals reviewed.    ED Treatments / Results  Labs (all labs ordered are listed, but only abnormal results are displayed) Labs Reviewed - No data to display  EKG  EKG Interpretation None       Radiology No results found.  Procedures Procedures (including critical care time)  Medications Ordered in ED Medications - No data to display   Initial Impression / Assessment and Plan / ED Course  I have reviewed the triage vital signs and the nursing notes.  Pertinent labs & imaging results that were available during my care of the patient were reviewed by me and considered in my medical decision making (see chart for details).     46 yo M with chief complaint of left arm tingling.  This been going on since yesterday.  With the patient's history of heroin abuse and this occurring right after he woke up this is most likely a nerve palsy.  Maps out to the radial nerve distribution on exam.  Will place in a splint.  Very unlikely to be a stroke as the patient has positive symptoms and no other symptoms other than left upper extremity paresthesias.  12:36 PM:  I have discussed the diagnosis/risks/treatment options with the patient and family and believe the pt to be eligible for discharge home to follow-up with PCP. We also discussed returning to the ED immediately if new or worsening sx occur. We discussed the sx which are most concerning (e.g., sudden worsening pain, fever, inability to tolerate by mouth) that necessitate immediate return. Medications administered to the patient during their visit and any new prescriptions provided to the patient are listed below.  Medications given during this visit Medications - No data to  display   The patient appears reasonably screen and/or stabilized for discharge and I doubt any other medical condition or other [facility] requiring further screening, evaluation, or treatment in the ED at this time prior to discharge.      Final Clinical Impressions(s) / ED Diagnoses   Final diagnoses:  Radial nerve palsy, left  Piriformis syndrome of left side    ED Discharge Orders        Ordered    Ambulatory referral to Neurology    Comments:  Radial nerve palsy   09/02/17 1236       Melene Plan, DO 09/02/17 1236

## 2017-09-04 LAB — CULTURE, BLOOD (ROUTINE X 2)
Culture: NO GROWTH
Special Requests: ADEQUATE

## 2017-09-24 ENCOUNTER — Inpatient Hospital Stay (HOSPITAL_COMMUNITY)
Admission: EM | Admit: 2017-09-24 | Discharge: 2017-09-29 | DRG: 917 | Disposition: A | Discharge status: Home / Self Care | Payer: Self-pay | Attending: Internal Medicine | Admitting: Internal Medicine

## 2017-09-24 ENCOUNTER — Emergency Department (HOSPITAL_COMMUNITY): Payer: Self-pay

## 2017-09-24 DIAGNOSIS — T401X1D Poisoning by heroin, accidental (unintentional), subsequent encounter: Secondary | ICD-10-CM

## 2017-09-24 DIAGNOSIS — J9602 Acute respiratory failure with hypercapnia: Secondary | ICD-10-CM

## 2017-09-24 DIAGNOSIS — J811 Chronic pulmonary edema: Secondary | ICD-10-CM

## 2017-09-24 DIAGNOSIS — F1110 Opioid abuse, uncomplicated: Secondary | ICD-10-CM | POA: Diagnosis present

## 2017-09-24 DIAGNOSIS — G92 Toxic encephalopathy: Secondary | ICD-10-CM | POA: Diagnosis present

## 2017-09-24 DIAGNOSIS — J189 Pneumonia, unspecified organism: Secondary | ICD-10-CM

## 2017-09-24 DIAGNOSIS — R402322 Coma scale, best motor response, extension, at arrival to emergency department: Secondary | ICD-10-CM | POA: Diagnosis present

## 2017-09-24 DIAGNOSIS — T401X4A Poisoning by heroin, undetermined, initial encounter: Secondary | ICD-10-CM

## 2017-09-24 DIAGNOSIS — J690 Pneumonitis due to inhalation of food and vomit: Secondary | ICD-10-CM | POA: Diagnosis present

## 2017-09-24 DIAGNOSIS — F17210 Nicotine dependence, cigarettes, uncomplicated: Secondary | ICD-10-CM | POA: Diagnosis present

## 2017-09-24 DIAGNOSIS — J9601 Acute respiratory failure with hypoxia: Secondary | ICD-10-CM

## 2017-09-24 DIAGNOSIS — R402112 Coma scale, eyes open, never, at arrival to emergency department: Secondary | ICD-10-CM | POA: Diagnosis present

## 2017-09-24 DIAGNOSIS — A419 Sepsis, unspecified organism: Secondary | ICD-10-CM | POA: Diagnosis present

## 2017-09-24 DIAGNOSIS — R6521 Severe sepsis with septic shock: Secondary | ICD-10-CM | POA: Diagnosis present

## 2017-09-24 DIAGNOSIS — I10 Essential (primary) hypertension: Secondary | ICD-10-CM | POA: Diagnosis present

## 2017-09-24 DIAGNOSIS — J9690 Respiratory failure, unspecified, unspecified whether with hypoxia or hypercapnia: Secondary | ICD-10-CM

## 2017-09-24 DIAGNOSIS — T401X1A Poisoning by heroin, accidental (unintentional), initial encounter: Principal | ICD-10-CM | POA: Diagnosis present

## 2017-09-24 DIAGNOSIS — N179 Acute kidney failure, unspecified: Secondary | ICD-10-CM | POA: Diagnosis present

## 2017-09-24 DIAGNOSIS — E872 Acidosis: Secondary | ICD-10-CM | POA: Diagnosis present

## 2017-09-24 DIAGNOSIS — E876 Hypokalemia: Secondary | ICD-10-CM | POA: Diagnosis present

## 2017-09-24 DIAGNOSIS — R402212 Coma scale, best verbal response, none, at arrival to emergency department: Secondary | ICD-10-CM | POA: Diagnosis present

## 2017-09-24 DIAGNOSIS — J80 Acute respiratory distress syndrome: Secondary | ICD-10-CM

## 2017-09-24 HISTORY — DX: Other psychoactive substance abuse, uncomplicated: F19.10

## 2017-09-24 HISTORY — DX: Essential (primary) hypertension: I10

## 2017-09-24 LAB — I-STAT CG4 LACTIC ACID, ED: LACTIC ACID, VENOUS: 6.16 mmol/L — AB (ref 0.5–1.9)

## 2017-09-24 LAB — CBC WITH DIFFERENTIAL/PLATELET
BASOS ABS: 0 10*3/uL (ref 0.0–0.1)
Basophils Relative: 0 %
EOS PCT: 0 %
Eosinophils Absolute: 0 10*3/uL (ref 0.0–0.7)
HCT: 44.5 % (ref 39.0–52.0)
HEMOGLOBIN: 14.4 g/dL (ref 13.0–17.0)
LYMPHS ABS: 1.5 10*3/uL (ref 0.7–4.0)
LYMPHS PCT: 6 %
MCH: 29.6 pg (ref 26.0–34.0)
MCHC: 32.4 g/dL (ref 30.0–36.0)
MCV: 91.6 fL (ref 78.0–100.0)
Monocytes Absolute: 0.6 10*3/uL (ref 0.1–1.0)
Monocytes Relative: 3 %
NEUTROS ABS: 22.6 10*3/uL — AB (ref 1.7–7.7)
NEUTROS PCT: 91 %
PLATELETS: 249 10*3/uL (ref 150–400)
RBC: 4.86 MIL/uL (ref 4.22–5.81)
RDW: 14 % (ref 11.5–15.5)
WBC: 24.8 10*3/uL — AB (ref 4.0–10.5)

## 2017-09-24 LAB — URINALYSIS, ROUTINE W REFLEX MICROSCOPIC
BILIRUBIN URINE: NEGATIVE
Glucose, UA: >=500 mg/dL — AB
KETONES UR: NEGATIVE mg/dL
LEUKOCYTES UA: NEGATIVE
Nitrite: NEGATIVE
PH: 5 (ref 5.0–8.0)
Protein, ur: NEGATIVE mg/dL
SPECIFIC GRAVITY, URINE: 1.015 (ref 1.005–1.030)

## 2017-09-24 LAB — COMPREHENSIVE METABOLIC PANEL
ALT: 20 U/L (ref 17–63)
ANION GAP: 14 (ref 5–15)
AST: 43 U/L — AB (ref 15–41)
Albumin: 3.4 g/dL — ABNORMAL LOW (ref 3.5–5.0)
Alkaline Phosphatase: 64 U/L (ref 38–126)
BILIRUBIN TOTAL: 0.8 mg/dL (ref 0.3–1.2)
BUN: 14 mg/dL (ref 6–20)
CHLORIDE: 107 mmol/L (ref 101–111)
CO2: 21 mmol/L — ABNORMAL LOW (ref 22–32)
Calcium: 8.1 mg/dL — ABNORMAL LOW (ref 8.9–10.3)
Creatinine, Ser: 1.52 mg/dL — ABNORMAL HIGH (ref 0.61–1.24)
GFR calc non Af Amer: 53 mL/min — ABNORMAL LOW (ref >60)
Glucose, Bld: 179 mg/dL — ABNORMAL HIGH (ref 65–99)
POTASSIUM: 4.3 mmol/L (ref 3.5–5.1)
Sodium: 142 mmol/L (ref 135–145)
TOTAL PROTEIN: 5.7 g/dL — AB (ref 6.5–8.1)

## 2017-09-24 LAB — BLOOD GAS, ARTERIAL
ACID-BASE DEFICIT: 2.9 mmol/L — AB (ref 0.0–2.0)
Bicarbonate: 22.8 mmol/L (ref 20.0–28.0)
Drawn by: 249101
FIO2: 80
MECHVT: 400 mL
O2 Saturation: 97.8 %
PEEP/CPAP: 12 cmH2O
PO2 ART: 116 mmHg — AB (ref 83.0–108.0)
Patient temperature: 99.1
RATE: 30 resp/min
pCO2 arterial: 50.2 mmHg — ABNORMAL HIGH (ref 32.0–48.0)
pH, Arterial: 7.28 — ABNORMAL LOW (ref 7.350–7.450)

## 2017-09-24 LAB — I-STAT ARTERIAL BLOOD GAS, ED
ACID-BASE DEFICIT: 5 mmol/L — AB (ref 0.0–2.0)
Acid-base deficit: 6 mmol/L — ABNORMAL HIGH (ref 0.0–2.0)
BICARBONATE: 24.4 mmol/L (ref 20.0–28.0)
Bicarbonate: 24.2 mmol/L (ref 20.0–28.0)
O2 SAT: 95 %
O2 Saturation: 94 %
PCO2 ART: 71.6 mmHg — AB (ref 32.0–48.0)
PCO2 ART: 61.8 mmHg — AB (ref 32.0–48.0)
PO2 ART: 101 mmHg (ref 83.0–108.0)
TCO2: 27 mmol/L (ref 22–32)
TCO2: 26 mmol/L (ref 22–32)
pH, Arterial: 7.141 — CL (ref 7.350–7.450)
pH, Arterial: 7.201 — ABNORMAL LOW (ref 7.350–7.450)
pO2, Arterial: 87 mmHg (ref 83.0–108.0)

## 2017-09-24 LAB — I-STAT TROPONIN, ED: Troponin i, poc: 0.06 ng/mL (ref 0.00–0.08)

## 2017-09-24 LAB — GLUCOSE, CAPILLARY
Glucose-Capillary: 111 mg/dL — ABNORMAL HIGH (ref 65–99)
Glucose-Capillary: 44 mg/dL — CL (ref 65–99)
Glucose-Capillary: 64 mg/dL — ABNORMAL LOW (ref 65–99)

## 2017-09-24 LAB — ETHANOL: Alcohol, Ethyl (B): <10 mg/dL (ref <10)

## 2017-09-24 LAB — LACTIC ACID, PLASMA: Lactic Acid, Venous: 2 mmol/L (ref 0.5–1.9)

## 2017-09-24 MED ORDER — FENTANYL CITRATE (PF) 100 MCG/2ML IJ SOLN
INTRAMUSCULAR | Status: AC
  Filled 2017-09-24: qty 2

## 2017-09-24 MED ORDER — ROCURONIUM BROMIDE 50 MG/5ML IV SOLN
INTRAVENOUS | Status: AC | PRN
  Administered 2017-09-24: 100 mg via INTRAVENOUS

## 2017-09-24 MED ORDER — MIDAZOLAM HCL 2 MG/2ML IJ SOLN
INTRAMUSCULAR | Status: AC
  Filled 2017-09-24: qty 4

## 2017-09-24 MED ORDER — FENTANYL CITRATE (PF) 100 MCG/2ML IJ SOLN
100.0000 ug | INTRAMUSCULAR | Status: AC | PRN
  Administered 2017-09-24 – 2017-09-25 (×3): 100 ug via INTRAVENOUS
  Filled 2017-09-24 (×2): qty 2

## 2017-09-24 MED ORDER — HEPARIN SODIUM (PORCINE) 5000 UNIT/ML IJ SOLN
5000.0000 [IU] | Freq: Three times a day (TID) | INTRAMUSCULAR | Status: DC
  Administered 2017-09-24 – 2017-09-26 (×5): 5000 [IU] via SUBCUTANEOUS
  Filled 2017-09-24 (×4): qty 1

## 2017-09-24 MED ORDER — FENTANYL CITRATE (PF) 100 MCG/2ML IJ SOLN
INTRAMUSCULAR | Status: AC | PRN
  Administered 2017-09-24: 50 ug via INTRAVENOUS

## 2017-09-24 MED ORDER — FENTANYL CITRATE (PF) 100 MCG/2ML IJ SOLN
100.0000 ug | INTRAMUSCULAR | Status: DC | PRN
  Administered 2017-09-24 – 2017-09-27 (×9): 100 ug via INTRAVENOUS
  Filled 2017-09-24 (×9): qty 2

## 2017-09-24 MED ORDER — LACTATED RINGERS IV BOLUS
2000.0000 mL | Freq: Once | INTRAVENOUS | Status: AC
  Administered 2017-09-24: 2000 mL via INTRAVENOUS

## 2017-09-24 MED ORDER — ETOMIDATE 2 MG/ML IV SOLN
INTRAVENOUS | Status: AC | PRN
  Administered 2017-09-24: 20 mg via INTRAVENOUS

## 2017-09-24 MED ORDER — MIDAZOLAM HCL 2 MG/2ML IJ SOLN
INTRAMUSCULAR | Status: AC
  Filled 2017-09-24: qty 2

## 2017-09-24 MED ORDER — SODIUM CHLORIDE 0.9 % IV SOLN: 250.0000 mL | INTRAVENOUS | Status: DC | PRN

## 2017-09-24 MED ORDER — SODIUM CHLORIDE 0.9 % IV SOLN
INTRAVENOUS | Status: DC
  Administered 2017-09-24: 22:00:00 via INTRAVENOUS

## 2017-09-24 MED ORDER — FAMOTIDINE 40 MG/5ML PO SUSR
20.0000 mg | Freq: Two times a day (BID) | ORAL | Status: DC
  Administered 2017-09-24 – 2017-09-27 (×6): 20 mg
  Filled 2017-09-24 (×6): qty 2.5

## 2017-09-24 MED ORDER — SODIUM CHLORIDE 0.9 % IV BOLUS
1000.0000 mL | Freq: Once | INTRAVENOUS | Status: AC
  Administered 2017-09-24: 1000 mL via INTRAVENOUS

## 2017-09-24 MED ORDER — PIPERACILLIN-TAZOBACTAM 3.375 G IVPB 30 MIN
3.3750 g | Freq: Once | INTRAVENOUS | Status: AC
  Administered 2017-09-24: 3.375 g via INTRAVENOUS
  Filled 2017-09-24: qty 50

## 2017-09-24 MED ORDER — DEXTROSE 50 % IV SOLN
INTRAVENOUS | Status: AC
  Administered 2017-09-24: 25 mL
  Filled 2017-09-24: qty 50

## 2017-09-24 MED ORDER — VANCOMYCIN HCL 10 G IV SOLR
2000.0000 mg | Freq: Once | INTRAVENOUS | Status: AC
  Administered 2017-09-24: 2000 mg via INTRAVENOUS
  Filled 2017-09-24: qty 2000

## 2017-09-24 MED ORDER — MIDAZOLAM HCL 2 MG/2ML IJ SOLN
2.0000 mg | INTRAMUSCULAR | Status: DC | PRN
  Administered 2017-09-25 – 2017-09-26 (×2): 2 mg via INTRAVENOUS
  Filled 2017-09-24 (×2): qty 2

## 2017-09-24 MED ORDER — MIDAZOLAM HCL 5 MG/5ML IJ SOLN
INTRAMUSCULAR | Status: AC | PRN
  Administered 2017-09-24: 4 mg via INTRAVENOUS

## 2017-09-24 MED ORDER — ORAL CARE MOUTH RINSE
15.0000 mL | OROMUCOSAL | Status: DC
  Administered 2017-09-24 – 2017-09-27 (×25): 15 mL via OROMUCOSAL

## 2017-09-24 MED ORDER — MIDAZOLAM HCL 2 MG/2ML IJ SOLN
2.0000 mg | INTRAMUSCULAR | Status: AC | PRN
  Administered 2017-09-24 (×3): 2 mg via INTRAVENOUS
  Filled 2017-09-24 (×2): qty 2

## 2017-09-24 MED ORDER — CHLORHEXIDINE GLUCONATE 0.12% ORAL RINSE (MEDLINE KIT)
15.0000 mL | Freq: Two times a day (BID) | OROMUCOSAL | Status: DC
  Administered 2017-09-24 – 2017-09-27 (×6): 15 mL via OROMUCOSAL

## 2017-09-24 NOTE — ED Provider Notes (Signed)
[facility] EMERGENCY DEPARTMENT Provider Note   CSN: [id number] Arrival date & time: 09/24/17  1630     History   Chief Complaint Chief Complaint  Patient presents with  . Drug Overdose    HPI Ricardo Hale is a 47 y.o. male.  The history is provided by the EMS personnel and medical records.  Drug Overdose  This is a recurrent problem. The current episode started 3 to 5 hours ago.  Patient has a history of heroin abuse and heroin overdose and has overdosed multiple times in the past.  He was found down in a local motel.  Last known normal was 11 AM.  Patient was given 0.4 mg of Narcan due to agonal breathing and he started breathing 40 times a minute and became tachycardic in the 130s.  Patient is unresponsive.  O2 sats in the 70s on nonrebreather.  No past medical history on file.  Patient Active Problem List   Diagnosis Date Noted  . Heroin overdose (HCC) 08/29/2017  . Respiratory arrest (HCC) 08/29/2017  . Aspiration pneumonia (HCC) 08/29/2017  . Pulmonary nodule 08/29/2017  . Acute kidney injury (HCC) 08/29/2017  . Heroin overdose, accidental or unintentional, initial encounter (HCC) 08/29/2017    No past surgical history on file.      Home Medications    Prior to Admission medications   Not on File    Family History No family history on file.  Social History Social History   Tobacco Use  . Smoking status: Current Every Day Smoker    Packs/day: 1.00    Types: Cigarettes  . Smokeless tobacco: Never Used  Substance Use Topics  . Alcohol use: No  . Drug use: No     Allergies   Patient has no known allergies.   Review of Systems Review of Systems  Unable to perform ROS: Patient unresponsive  Level 5, the patient unresponsive   Physical Exam Updated Vital Signs BP (!) 141/96   Pulse (!) 106   Temp 97.7 F (36.5 C)   Resp (!) 26   Ht 5\' 7"  (1.702 m)   SpO2 97%   BMI 30.23 kg/m   Physical Exam  Constitutional: He  appears well-developed and well-nourished. He appears distressed.  HENT:  Head: Normocephalic and atraumatic.  Eyes: Conjunctivae are normal.  Pinpoint pupils  Neck: Neck supple.  Cardiovascular: Regular rhythm and intact distal pulses. Tachycardia present.  No murmur heard. Pulmonary/Chest: Tachypnea noted. He is in respiratory distress. He has rhonchi. He has rales.  Abdominal: Soft. He exhibits no distension. There is no tenderness. There is no guarding.  Musculoskeletal: He exhibits no edema.  Neurological: He is unresponsive. He exhibits abnormal muscle tone (increased tonicity in extremities). GCS eye subscore is 1. GCS verbal subscore is 1. GCS motor subscore is 2.  Skin: Skin is warm and dry.  Psychiatric: He has a normal mood and affect.  Nursing note and vitals reviewed.    ED Treatments / Results  Labs (all labs ordered are listed, but only abnormal results are displayed) Labs Reviewed  URINALYSIS, ROUTINE W REFLEX MICROSCOPIC - Abnormal; Notable for the following components:      Result Value   APPearance HAZY (*)    Glucose, UA >=500 (*)    Hgb urine dipstick SMALL (*)    Bacteria, UA RARE (*)    Squamous Epithelial / LPF 0-5 (*)    All other components within normal limits  I-STAT CG4 LACTIC ACID, ED - Abnormal;  Notable for the following components:   Lactic Acid, Venous 6.16 (*)    All other components within normal limits  I-STAT ARTERIAL BLOOD GAS, ED - Abnormal; Notable for the following components:   pH, Arterial 7.141 (*)    pCO2 arterial 71.6 (*)    Acid-base deficit 6.0 (*)    All other components within normal limits  CULTURE, BLOOD (ROUTINE X 2)  CULTURE, BLOOD (ROUTINE X 2)  URINE CULTURE  CULTURE, RESPIRATORY (NON-EXPECTORATED)  COMPREHENSIVE METABOLIC PANEL  CBC WITH DIFFERENTIAL/PLATELET  BLOOD GAS, ARTERIAL  RAPID URINE DRUG SCREEN, HOSP PERFORMED  ETHANOL  I-STAT TROPONIN, ED  I-STAT CG4 LACTIC ACID, ED    EKG EKG  Interpretation  Date/Time:  Thursday September 24 2017 16:32:23 EDT Ventricular Rate:  122 PR Interval:    QRS Duration: 87 QT Interval:  320 QTC Calculation: 458 R Axis:   90 Text Interpretation:  Sinus tachycardia Consider right atrial enlargement Borderline right axis deviation Repol abnrm suggests ischemia, inferior leads Minimal ST elevation, lateral leads Baseline wander in lead(s) II III aVF rate fatser than previous Confirmed by Richardean CanalYao, David H [phone]([id number]) on 09/24/2017 6:06:27 PM   Radiology Ct Head Wo Contrast  Result Date: 09/24/2017 CLINICAL DATA:  Altered LOC EXAM: CT HEAD WITHOUT CONTRAST TECHNIQUE: Contiguous axial images were obtained from the base of the skull through the vertex without intravenous contrast. COMPARISON:  None. FINDINGS: Brain: No evidence of acute infarction, hemorrhage, hydrocephalus, extra-axial collection or mass lesion/mass effect. Vascular: No hyperdense vessel or unexpected calcification. Skull: Normal. Negative for fracture or focal lesion. Sinuses/Orbits: Moderate mucosal thickening in the right maxillary sinus with mucosal thickening in the ethmoid sinuses. No acute orbital abnormality Other: None IMPRESSION: Negative non contrasted CT appearance of the brain.  Sinus disease Electronically Signed   By: Jasmine PangKim  Fujinaga M.D.   On: 09/24/2017 18:18   Dg Chest Portable 1 View  Result Date: 09/24/2017 CLINICAL DATA:  Intubated EXAM: PORTABLE CHEST 1 VIEW COMPARISON:  08/29/2017 FINDINGS: Endotracheal tube tip is about 3.5 cm superior to the carina. Esophageal tube tip is below the diaphragm but non included. Borderline cardiomegaly. Extensive airspace disease bilaterally. No pneumothorax. IMPRESSION: 1. Endotracheal tube tip about 3.5 cm superior to carina. Esophageal tube tip below diaphragm but non included 2. Extensive bilateral airspace disease which may be secondary to edema, diffuse pneumonia, or ARDS. Electronically Signed   By: Jasmine PangKim  Fujinaga M.D.   On: 09/24/2017  17:17    Procedures Procedure Name: Intubation Date/Time: 09/24/2017 5:53 PM Performed by: Dwana Melenaong, Kanishk Stroebel, DO Pre-anesthesia Checklist: Patient identified Oxygen Delivery Method: Ambu bag Preoxygenation: Pre-oxygenation with 100% oxygen Induction Type: Rapid sequence Ventilation: Mask ventilation without difficulty Laryngoscope Size: Glidescope Grade View: Grade I Tube type: Subglottic suction tube Tube size: 8.0 mm Number of attempts: 1 Airway Equipment and Method: Rigid stylet and Video-laryngoscopy Placement Confirmation: ETT inserted through vocal cords under direct vision,  Breath sounds checked- equal and bilateral and CO2 detector      (including critical care time)  Medications Ordered in ED Medications  fentaNYL (SUBLIMAZE) 100 MCG/2ML injection (has no administration in time range)  midazolam (VERSED) 2 MG/2ML injection (has no administration in time range)  vancomycin (VANCOCIN) 2,000 mg in sodium chloride 0.9 % 500 mL IVPB (2,000 mg Intravenous New Bag/Given 09/24/17 1824)  piperacillin-tazobactam (ZOSYN) IVPB 3.375 g (3.375 g Intravenous New Bag/Given 09/24/17 1837)  lactated ringers bolus 2,000 mL (2,000 mLs Intravenous New Bag/Given 09/24/17 1746)  fentaNYL (SUBLIMAZE) injection 100 mcg (has no  administration in time range)  fentaNYL (SUBLIMAZE) injection 100 mcg (has no administration in time range)  midazolam (VERSED) injection 2 mg (has no administration in time range)  midazolam (VERSED) injection 2 mg (has no administration in time range)  etomidate (AMIDATE) injection (20 mg Intravenous Given 09/24/17 1633)  rocuronium (ZEMURON) injection (100 mg Intravenous Given 09/24/17 1633)  midazolam (VERSED) 5 MG/5ML injection (4 mg Intravenous Given 09/24/17 1640)  fentaNYL (SUBLIMAZE) injection (50 mcg Intravenous Given 09/24/17 1640)     Initial Impression / Assessment and Plan / ED Course  I have reviewed the triage vital signs and the nursing notes.  Pertinent labs  & imaging results that were available during my care of the patient were reviewed by me and considered in my medical decision making (see chart for details).     She is a 47 year old male history of heroin abuse and heroin overdose, prior aspiration pneumonia who presents after a likely heroin overdose.  Patient was found unresponsive and down around 3:45 PM.  Last seen normal was 11 AM.  EMS gave 0.4 mg of Narcan which resolved his agonal breathing and he was breathing 40 times a minute and tachycardic.  His sats were in the low 70s despite full nonrebreather support.  He was noted to have diffuse rhonchi concerning for aspiration.  Next  On arrival patient is unresponsive, he is breathing spontaneously and has coarse breath sounds throughout with crackles and rhonchi in all lung fields.  He is satting in the low 70s on nonrebreather.  Patient was given bag valve mask ventilations with improvement in O2 sats to the low 80s.  Patient is GCS of 4 and is noted to have increased muscle tone.  He is noted to only mildly extend to painful stimuli.  Next  Patient is intubated for respiratory failure and low GCS.  Chest x-ray obtained shows signs of ARDS.  Patient placed on ARDS ventilator protocol.  Initial blood gas after intubation showed respiratory acidosis so the respiratory rate is increased but will keep low tidal volumes due to the ARDS.    Patient started on vancomycin and Zosyn given his IV drug use and likely massive aspiration.  CT head obtained which did not show any obvious abnormalities, however concern for hypoxic brain injury due to likely prolonged downtime with profound hypoxia.  Patient resuscitated with IV fluids which improved his heart rate.  Patient also has a rising lactate.  Patient will be admitted to the ICU for further management of ARDS, respiratory failure, possible anoxic brain injury.      Final Clinical Impressions(s) / ED Diagnoses   Final diagnoses:  ARDS (adult  respiratory distress syndrome) (HCC)  Aspiration pneumonia, unspecified aspiration pneumonia type, unspecified laterality, unspecified part of lung (HCC)  Diacetylmorphine overdose, undetermined intent, initial encounter (HCC)  Acute respiratory failure with hypoxia and hypercapnia [facility])    ED Discharge Orders    None       Dwana Melena, DO 09/24/17 1845    Charlynne Pander, MD 09/25/17 [phone]

## 2017-09-24 NOTE — Progress Notes (Signed)
ABG results reported to MD. New vent orders.  ARDS prot started per MD order, will follow up with ABG.

## 2017-09-24 NOTE — Progress Notes (Signed)
Hypoglycemic Event  CBG: 64  Treatment: D50 IV 25 mL  Symptoms: None  Follow-up CBG: Time:10:11 PM  CBG Result:111  Possible Reasons for Event: Inadequate meal intake  Comments/MD notified:    Della GooCummings, Halen Antenucci R

## 2017-09-24 NOTE — Progress Notes (Signed)
Wasted 50 mcg fent with rn millie

## 2017-09-24 NOTE — ED Notes (Signed)
Pt to ED from a local motel after being found unresponsive with agonal resp.  Pt does have hx of heroin use.  EMS gave pt Narcan 0.4mg    On arrival to ED pt remains unresponsive and tachypneic

## 2017-09-24 NOTE — Progress Notes (Signed)
RT transported pt form ER TRAB to 74M 04 without any complications.

## 2017-09-24 NOTE — H&P (Signed)
PULMONARY / CRITICAL CARE MEDICINE   Name: Ricardo Hale MRN: [id number] DOB: 1970/12/17    ADMISSION DATE:  09/24/2017 CONSULTATION DATE:  09/24/2017  REFERRING MD:  Dr. Silverio Lay EDP  CHIEF COMPLAINT:  ARDS  HISTORY OF PRESENT ILLNESS:   47 year old male with PMH significant for opioid overdose in past with multiple admissions for this (thought to be heroin). He lives with his significant other. She was not at bedside. His family who was at bedside (mom and sister) saw him last 3/24 at church. He was fine and had no complaints. On 3/28 he was at home and was on his own for four hours. When he was found he was unresponsive. Upon EMS arrival he had agonal respirations, which significantly improved with narcan administration. He remained unresponsive. He was placed on supplemental O2 for hypoxia. Upon arrival to ED he remained unresponsive with O2 sat 70% on NRB. He was emergently intubated. He required high PEEP and FiO2. CXR concerning for ARDS. PCCM asked to admit.   PAST MEDICAL HISTORY :  He  has no past medical history on file.  PAST SURGICAL HISTORY: He  has no past surgical history on file.  No Known Allergies  No current facility-administered medications on file prior to encounter.    No current outpatient medications on file prior to encounter.    FAMILY HISTORY:  His has no family status information on file.    SOCIAL HISTORY: He  reports that he has been smoking cigarettes.  He has been smoking about 1.00 pack per day. He has never used smokeless tobacco. He reports that he does not drink alcohol or use drugs.  REVIEW OF SYSTEMS:   unable  SUBJECTIVE:    VITAL SIGNS: BP 109/82   Pulse (!) 101   Temp 98.8 F (37.1 C)   Resp (!) 30   Ht 5\' 7"  (1.702 m)   SpO2 100%   BMI 30.23 kg/m   HEMODYNAMICS:    VENTILATOR SETTINGS: Vent Mode: PRVC FiO2 (%):  [80 %-100 %] 80 % Set Rate:  [16 bmp-30 bmp] 30 bmp Vt Set:  [400 mL] 400 mL PEEP:  [10 cmH20-12 cmH20] 12  cmH20 Plateau Pressure:  [26 cmH20-28 cmH20] 26 cmH20  INTAKE / OUTPUT: I/O last 3 completed shifts: In: -  Out: 200 [Urine:200]  PHYSICAL EXAMINATION: General:  Middle aged male on vent.  Neuro:  GCS -2. Able to follow some simple commands.  HEENT:  [place]/AT, PERRL, no JVD Cardiovascular:  RRR, no MRG Lungs:  Rhonchi throughout.  Abdomen:  Soft, non-distended Musculoskeletal:  No acute deformity. Seems to be moving all 4 extremities.  Skin:  Grossly intact.   LABS:  BMET Recent Labs  Lab 09/24/17 1740  NA 142  K 4.3  CL 107  CO2 21*  BUN 14  CREATININE 1.52*  GLUCOSE 179*    Electrolytes Recent Labs  Lab 09/24/17 1740  CALCIUM 8.1*    CBC Recent Labs  Lab 09/24/17 1740  WBC 24.8*  HGB 14.4  HCT 44.5  PLT 249    Coag's No results for input(s): APTT, INR in the last 168 hours.  Sepsis Markers Recent Labs  Lab 09/24/17 1753  LATICACIDVEN 6.16*    ABG Recent Labs  Lab 09/24/17 1731 09/24/17 1835  PHART 7.141* 7.201*  PCO2ART 71.6* 61.8*  PO2ART 101.0 87.0    Liver Enzymes Recent Labs  Lab 09/24/17 1740  AST 43*  ALT 20  ALKPHOS 64  BILITOT 0.8  ALBUMIN  3.4*    Cardiac Enzymes No results for input(s): TROPONINI, PROBNP in the last 168 hours.  Glucose No results for input(s): GLUCAP in the last 168 hours.  Imaging Ct Head Wo Contrast  Result Date: 09/24/2017 CLINICAL DATA:  Altered LOC EXAM: CT HEAD WITHOUT CONTRAST TECHNIQUE: Contiguous axial images were obtained from the base of the skull through the vertex without intravenous contrast. COMPARISON:  None. FINDINGS: Brain: No evidence of acute infarction, hemorrhage, hydrocephalus, extra-axial collection or mass lesion/mass effect. Vascular: No hyperdense vessel or unexpected calcification. Skull: Normal. Negative for fracture or focal lesion. Sinuses/Orbits: Moderate mucosal thickening in the right maxillary sinus with mucosal thickening in the ethmoid sinuses. No acute orbital  abnormality Other: None IMPRESSION: Negative non contrasted CT appearance of the brain.  Sinus disease Electronically Signed   By: Jasmine PangKim  Fujinaga M.D.   On: 09/24/2017 18:18   Dg Chest Portable 1 View  Result Date: 09/24/2017 CLINICAL DATA:  Intubated EXAM: PORTABLE CHEST 1 VIEW COMPARISON:  08/29/2017 FINDINGS: Endotracheal tube tip is about 3.5 cm superior to the carina. Esophageal tube tip is below the diaphragm but non included. Borderline cardiomegaly. Extensive airspace disease bilaterally. No pneumothorax. IMPRESSION: 1. Endotracheal tube tip about 3.5 cm superior to carina. Esophageal tube tip below diaphragm but non included 2. Extensive bilateral airspace disease which may be secondary to edema, diffuse pneumonia, or ARDS. Electronically Signed   By: Jasmine PangKim  Fujinaga M.D.   On: 09/24/2017 17:17     STUDIES:  CT head 3/28 > nonacute  CULTURES: Blood 3/28 > Urine 3/28 > Tracheal aspirate 3/28 >  ANTIBIOTICS: Zosyn 3/28 >  Vancomycin 3/28 >   SIGNIFICANT EVENTS: Admitted 3/28   LINES/TUBES: ETT 3/28 >  DISCUSSION: 47 year old male admitted after a suspected heroin overdose. Emergently intubated for hypoxia. Radiography concerning for ARDS, requiring high PEEP and FiO2. Suspected aspiration.   ASSESSMENT / PLAN:  PULMONARY A: ARDS Suspected aspiration  P:   Full vent support - Currently requiring 80% and 12 PEEP.  Follow ABG CXR Follow Plateau pressure VAP bundle  CARDIOVASCULAR A:  No acute issues  P:  Tele  RENAL A:   AKI  P:   Hydrate Follow BMP  GASTROINTESTINAL A:   No acute issues  P:   NPO Pepcid for SUP  HEMATOLOGIC A:   No acute issues  P:  Heparin SQ SCDs Follow CBC  INFECTIOUS A:   Suspected aspiration PNA, but recently hospitalized  P:   Continue HCAP coverage as above Follow cultures  ENDOCRINE A:   No acute issues  P:   Follow glucose on chemistry  NEUROLOGIC A:   Acute toxic vs metabolic encephalopathy P:    RASS goal: 0 to -1 PRN sedation fentanyl and versed.  Neuro checks.    FAMILY  - Updates: Mother and sister updated. Significant other not to be updated. This is why patient has XXX Engineer, materialsdesignation.   - Inter-disciplinary family meet or Palliative Care meeting due by: 4/6    Joneen RoachPaul Concettina Leth, AGACNP-BC [facility] Pager [phone] or [phone]  09/24/2017 9:05 PM

## 2017-09-25 ENCOUNTER — Inpatient Hospital Stay (HOSPITAL_COMMUNITY): Payer: Self-pay

## 2017-09-25 DIAGNOSIS — J9602 Acute respiratory failure with hypercapnia: Secondary | ICD-10-CM

## 2017-09-25 DIAGNOSIS — J9601 Acute respiratory failure with hypoxia: Secondary | ICD-10-CM

## 2017-09-25 LAB — RAPID URINE DRUG SCREEN, HOSP PERFORMED
AMPHETAMINES: NOT DETECTED
BARBITURATES: NOT DETECTED
BENZODIAZEPINES: POSITIVE — AB
Cocaine: NOT DETECTED
Opiates: POSITIVE — AB
Tetrahydrocannabinol: NOT DETECTED

## 2017-09-25 LAB — BLOOD GAS, ARTERIAL
Acid-base deficit: 0.2 mmol/L (ref 0.0–2.0)
Bicarbonate: 24.2 mmol/L (ref 20.0–28.0)
Drawn by: 249101
FIO2: 60
MECHVT: 400 mL
O2 Saturation: 98 %
PATIENT TEMPERATURE: 98.6
PCO2 ART: 41.6 mmHg (ref 32.0–48.0)
PEEP: 8 cmH2O
RATE: 30 resp/min
pH, Arterial: 7.384 (ref 7.350–7.450)
pO2, Arterial: 105 mmHg (ref 83.0–108.0)

## 2017-09-25 LAB — BASIC METABOLIC PANEL
Anion gap: 9 (ref 5–15)
BUN: 12 mg/dL (ref 6–20)
CALCIUM: 8 mg/dL — AB (ref 8.9–10.3)
CHLORIDE: 109 mmol/L (ref 101–111)
CO2: 23 mmol/L (ref 22–32)
CREATININE: 1.21 mg/dL (ref 0.61–1.24)
GFR calc Af Amer: >60 mL/min (ref >60)
Glucose, Bld: 125 mg/dL — ABNORMAL HIGH (ref 65–99)
Potassium: 3.8 mmol/L (ref 3.5–5.1)
SODIUM: 141 mmol/L (ref 135–145)

## 2017-09-25 LAB — LACTIC ACID, PLASMA: LACTIC ACID, VENOUS: 1.6 mmol/L (ref 0.5–1.9)

## 2017-09-25 LAB — CBC
HCT: 36.6 % — ABNORMAL LOW (ref 39.0–52.0)
Hemoglobin: 12.3 g/dL — ABNORMAL LOW (ref 13.0–17.0)
MCH: 29.9 pg (ref 26.0–34.0)
MCHC: 33.6 g/dL (ref 30.0–36.0)
MCV: 89.1 fL (ref 78.0–100.0)
PLATELETS: 222 10*3/uL (ref 150–400)
RBC: 4.11 MIL/uL — ABNORMAL LOW (ref 4.22–5.81)
RDW: 14.1 % (ref 11.5–15.5)
WBC: 22.2 10*3/uL — ABNORMAL HIGH (ref 4.0–10.5)

## 2017-09-25 LAB — GLUCOSE, CAPILLARY
GLUCOSE-CAPILLARY: 134 mg/dL — AB (ref 65–99)
GLUCOSE-CAPILLARY: 75 mg/dL (ref 65–99)
Glucose-Capillary: 103 mg/dL — ABNORMAL HIGH (ref 65–99)
Glucose-Capillary: 120 mg/dL — ABNORMAL HIGH (ref 65–99)
Glucose-Capillary: 128 mg/dL — ABNORMAL HIGH (ref 65–99)
Glucose-Capillary: 151 mg/dL — ABNORMAL HIGH (ref 65–99)

## 2017-09-25 LAB — PROCALCITONIN: PROCALCITONIN: 9.46 ng/mL

## 2017-09-25 LAB — URINE CULTURE: Culture: NO GROWTH

## 2017-09-25 LAB — PHOSPHORUS: PHOSPHORUS: 2.7 mg/dL (ref 2.5–4.6)

## 2017-09-25 LAB — MAGNESIUM: Magnesium: 1.5 mg/dL — ABNORMAL LOW (ref 1.7–2.4)

## 2017-09-25 LAB — MRSA PCR SCREENING: MRSA by PCR: NEGATIVE

## 2017-09-25 MED ORDER — LACTATED RINGERS IV BOLUS
750.0000 mL | Freq: Once | INTRAVENOUS | Status: AC
  Administered 2017-09-25: 750 mL via INTRAVENOUS

## 2017-09-25 MED ORDER — VITAL HIGH PROTEIN PO LIQD
1000.0000 mL | ORAL | Status: DC
  Administered 2017-09-25: 1000 mL
  Filled 2017-09-25 (×2): qty 1000

## 2017-09-25 MED ORDER — PRO-STAT SUGAR FREE PO LIQD
30.0000 mL | Freq: Every day | ORAL | Status: DC
  Administered 2017-09-26 – 2017-09-27 (×2): 30 mL
  Filled 2017-09-25 (×2): qty 30

## 2017-09-25 MED ORDER — LACTATED RINGERS IV SOLN
INTRAVENOUS | Status: DC
  Administered 2017-09-25 – 2017-09-26 (×3): via INTRAVENOUS

## 2017-09-25 MED ORDER — VANCOMYCIN HCL IN DEXTROSE 1-5 GM/200ML-% IV SOLN
1000.0000 mg | Freq: Two times a day (BID) | INTRAVENOUS | Status: DC
  Administered 2017-09-25 – 2017-09-27 (×5): 1000 mg via INTRAVENOUS
  Filled 2017-09-25 (×6): qty 200

## 2017-09-25 MED ORDER — MAGNESIUM SULFATE 4 GM/100ML IV SOLN
4.0000 g | Freq: Once | INTRAVENOUS | Status: AC
  Administered 2017-09-25: 4 g via INTRAVENOUS
  Filled 2017-09-25: qty 100

## 2017-09-25 MED ORDER — SODIUM CHLORIDE 0.9 % IV SOLN
INTRAVENOUS | Status: DC
  Administered 2017-09-25: 13:00:00 via INTRAVENOUS

## 2017-09-25 MED ORDER — LACTATED RINGERS IV BOLUS
500.0000 mL | Freq: Once | INTRAVENOUS | Status: AC
  Administered 2017-09-25: 500 mL via INTRAVENOUS

## 2017-09-25 MED ORDER — DEXMEDETOMIDINE HCL IN NACL 200 MCG/50ML IV SOLN
0.0000 ug/kg/h | INTRAVENOUS | Status: DC
  Administered 2017-09-25: 0.9 ug/kg/h via INTRAVENOUS
  Administered 2017-09-25 (×3): 0.8 ug/kg/h via INTRAVENOUS
  Administered 2017-09-25: 0.4 ug/kg/h via INTRAVENOUS
  Administered 2017-09-25: 0.8 ug/kg/h via INTRAVENOUS
  Administered 2017-09-25: 0.4 ug/kg/h via INTRAVENOUS
  Administered 2017-09-25: 0.9 ug/kg/h via INTRAVENOUS
  Filled 2017-09-25 (×5): qty 50
  Filled 2017-09-25: qty 100
  Filled 2017-09-25 (×4): qty 50

## 2017-09-25 MED ORDER — PRO-STAT SUGAR FREE PO LIQD
30.0000 mL | Freq: Two times a day (BID) | ORAL | Status: DC
  Administered 2017-09-25: 30 mL
  Filled 2017-09-25: qty 30

## 2017-09-25 MED ORDER — PIPERACILLIN-TAZOBACTAM 3.375 G IVPB
3.3750 g | Freq: Three times a day (TID) | INTRAVENOUS | Status: DC
  Administered 2017-09-25 – 2017-09-28 (×11): 3.375 g via INTRAVENOUS
  Filled 2017-09-25 (×11): qty 50

## 2017-09-25 MED ORDER — VITAL AF 1.2 CAL PO LIQD
1000.0000 mL | ORAL | Status: DC
  Administered 2017-09-25 – 2017-09-26 (×2): 1000 mL
  Filled 2017-09-25 (×8): qty 1000

## 2017-09-25 NOTE — Progress Notes (Signed)
Patient registration reached out to myself and Clarita CraneKeely, RN, as to who placed the patient in XXX status.  Patient is ventilated and on precedex and unable to speak or make decisions for himself.  Mother and sister at bedside and states that patient is legally married, but have been separated for 3 years.  Wife was escorted out last night by security in the ED, per mother.   I gave what looked like a wedding band (was in a cup) to daughter when arriving on shift this am.  Wanted to clear up the miscommunication as to who put patient on XXX, was in fact the mother who made him private so that the wife would not get information about him, even though he is legally married.   Left message with social work, Engineer, civil (consulting)Jenna.  Unclear as to what next steps should be taken.      Montine CircleKim Mccayla Shimada, RN

## 2017-09-25 NOTE — Progress Notes (Signed)
Pharmacy Antibiotic Note  Ricardo Hale is a 47 y.o. male admitted on 09/24/2017 with pneumonia.  Pharmacy has been consulted for vancomycin and zosyn dosing. Vancomycin 2g and zosyn 3.375 gm given earlier today  Plan: Vancomycin 1000 IV every 12 hours.  Goal trough 15-20 mcg/mL. Zosyn 3.375g IV q8h (4 hour infusion).  F/u renal function, cultures and clinical course  Height: 5\' 11"  (180.3 cm) Weight: 200 lb (90.7 kg) IBW/kg (Calculated) : 75.3  Temp (24hrs), Avg:97.9 F (36.6 C), Min:96.8 F (36 C), Max:99.4 F (37.4 C)  Recent Labs  Lab 09/24/17 1740 09/24/17 1753 09/24/17 2229  WBC 24.8*  --   --   CREATININE 1.52*  --   --   LATICACIDVEN  --  6.16* 2.0*    Estimated Creatinine Clearance: 70 mL/min (A) (by C-G formula based on SCr of 1.52 mg/dL (H)).    No Known Allergies  Antimicrobials this admission: vanc 3/28 >>  zosyn 3/28 >>    Thank you for allowing pharmacy to be a part of this patient's care.  Talbert CageSeay, Lowella Kindley Poteet 09/25/2017 1:02 AM

## 2017-09-25 NOTE — Progress Notes (Addendum)
Osborne Cascoadia CSW was consulted in regards to the issue of pt being an XXX. The XXX has been lifted per patient registration. Mother was informed of the XXX being lifted and we did explain if the wife was to arrive at the hospital and cause any type of disruption, security can remove them.

## 2017-09-25 NOTE — Progress Notes (Signed)
Initial Nutrition Assessment  DOCUMENTATION CODES:   Not applicable  INTERVENTION:   Vital 1.2 @ 62mL/hr x24hrs with 30mL prostat daily This provides 2260kcal, 150g protein, and free water. Meets 100-105% of pts' estimated needs.  NUTRITION DIAGNOSIS:   Inadequate oral intake related to inability to eat as evidenced by NPO status.  GOAL:   Patient will meet greater than or equal to 90% of their needs  MONITOR:   Weight trends, TF tolerance, Vent status, Labs, I & O's  REASON FOR ASSESSMENT:   Consult Enteral/tube feeding initiation and management  ASSESSMENT:   47 yo male admitted after a suspected heroin overdose. Emergently intubated for hypoxia. Pt with ARDS suspected due to aspiration. Significant other NOT to be updated; This is the reason for pt's XXX designation.   TF protocol initiated. MAP within MD goal >65 Not on pressors. Noted low Mg; supplementation being addressed.   Pt remains on ventilator support with observed minute ventilation 11.4L/min. Tmax 37.7C used to calculate needs.  Net positive 2.9L since admit 1.2L urine output x24 hours  Pt's sisters at bedside report pt had a normal appetite PTA; However, family feels pt may have had decreased food intake over the past few weeks due to issues with food procurement/purchasing.  Family states pt's wt has fluctuated in the past few months but do not feel he has had any significant or alarming wt loss. Limited wt hx outlined below.  Medications: Pepcid, NS IVF d/c'd this morning, lactated ringers bolus x2, Mg sulfate, zosyn, vancomycin.  Labs: Mg (L;1.5), Ca (L;8.0) K, Phos WNL  NUTRITION - FOCUSED PHYSICAL EXAM:    Most Recent Value  Orbital Region  No depletion  Upper Arm Region  No depletion  Thoracic and Lumbar Region  No depletion  Buccal Region  Unable to assess  Temple Region  Mild depletion  Clavicle Bone Region  No depletion  Clavicle and Acromion Bone Region  No depletion   Scapular Bone Region  Unable to assess  Dorsal Hand  No depletion  Patellar Region  No depletion  Anterior Thigh Region  No depletion  Posterior Calf Region  No depletion  Edema (RD Assessment)  Severe [Pt with generalized edema, mild pitting noted on bilateral feet.]  Hair  Reviewed  Eyes  Unable to assess  Mouth  Unable to assess  Skin  Reviewed  Nails  Reviewed      Diet Order:  Diet NPO time specified  EDUCATION NEEDS:   Education needs have been addressed  Skin:  Skin Assessment: Reviewed RN Assessment  Last BM:  PTA; abdomen soft  Height:   Ht Readings from Last 1 Encounters:  09/24/17  (1.803 m)    Weight:  Wt Readings from Last 5 Encounters:  09/25/17 195 lb 5.2 oz (88.6 kg)  08/29/17 193 lb (87.5 kg)  03/27/14 160 lb (72.6 kg)    Ideal Body Weight:  78.2 kg  BMI:  Body mass index is 27.24 kg/m.  Estimated Nutritional Needs:   Kcal:  2154  Protein:  133-151g  Fluid:  2.1L    Iban Utz, MS, Dietetic Intern Pager # [phone]

## 2017-09-25 NOTE — Progress Notes (Signed)
Noted by RN that patient "XXX" removed. Made XXX by mother on admission last night to keep ex wife from visiting- but found out that he is legally married still. XXX to stay lifted. Consulting civil engineerCharge RN and bedside RN informed Risk Management and [facility] of situation. Family aware and informed that wife will be removed if she attempts to cause issues.

## 2017-09-25 NOTE — Progress Notes (Addendum)
CSW consulted CSW Director regarding patient's mother's request for XXX. CSW was informed that patient and wife have been separated but not divorced and wife lives with patient. Unless patient and wife are legally separated (and can provide legal paperwork) we are unable to prevent her from accessing patient. She would be the next of kin. If she arrives at the hospital and causes disruption, hospital staff or security are able to ask her to leave.   Osborne Cascoadia Lenee Franze LCSW [phone]

## 2017-09-25 NOTE — Progress Notes (Signed)
PULMONARY / CRITICAL CARE MEDICINE   Name: Ricardo Hale MRN: [id number] DOB: [birth year]    ADMISSION DATE:  09/24/2017 CONSULTATION DATE:  09/24/2017  REFERRING MD:  Dr. Silverio Lay EDP  CHIEF COMPLAINT:  ARDS  HISTORY OF PRESENT ILLNESS:   47 year old male with PMH significant for opioid overdose in past with multiple admissions for this (thought to be heroin). He lives with his significant other. She was not at bedside. His family who was at bedside (mom and sister) saw him last 3/24 at church. He was fine and had no complaints. On 3/28 he was at home and was on his own for four hours. When he was found he was unresponsive. Upon EMS arrival he had agonal respirations, which significantly improved with narcan administration. He remained unresponsive. He was placed on supplemental O2 for hypoxia. Upon arrival to ED he remained unresponsive with O2 sat 70% on NRB. He was emergently intubated. He required high PEEP and FiO2. CXR concerning for ARDS. PCCM asked to admit.     SUBJECTIVE:  Hypotensive     VENTILATOR SETTINGS: Vent Mode: PRVC FiO2 (%):  [50 %-100 %] 50 % Set Rate:  [16 bmp-30 bmp] 30 bmp Vt Set:  [400 mL] 400 mL PEEP:  [8 cmH20-12 cmH20] 8 cmH20 Plateau Pressure:  [19 cmH20-28 cmH20] 23 cmH20  INTAKE / OUTPUT:  Intake/Output Summary (Last 24 hours) at 09/25/2017 0943 Last data filed at 09/25/2017 0800 Gross per 24 hour  Intake 3391.25 ml  Output 1315 ml  Net 2076.25 ml     PHYSICAL EXAMINATION: General: 47 year old male patient currently resting on full ventilator support. HEENT: Normocephalic atraumatic no jugular venous distention he is orally intubated Pulmonary: Scattered rhonchi, equal chest rise, plateau pressure less than 25, weaning PEEP/FiO2 Cardiac: Regular rate and rhythm no murmur rub or gallop Abdomen: Soft, nontender, positive bowel sounds, no organomegaly. Extremities/musculoskeletal: Warm, dry, brisk cap refill, strong pulses.  No edema. Neuro/psych:  Awake, follows commands, interactive and appropriate. LABS:  BMET Recent Labs  Lab 09/24/17 1740 09/25/17 0715  NA 142 141  K 4.3 3.8  CL 107 109  CO2 21* 23  BUN 14 12  CREATININE 1.52* 1.21  GLUCOSE 179* 125*    Electrolytes Recent Labs  Lab 09/24/17 1740 09/25/17 0715  CALCIUM 8.1* 8.0*  MG  --  1.5*  PHOS  --  2.7    CBC Recent Labs  Lab 09/24/17 1740 09/25/17 0715  WBC 24.8* 22.2*  HGB 14.4 12.3*  HCT 44.5 36.6*  PLT 249 222    Coag's No results for input(s): APTT, INR in the last 168 hours.  Sepsis Markers Recent Labs  Lab 09/24/17 1753 09/24/17 2229 09/25/17 0715  LATICACIDVEN 6.16* 2.0* 1.6    ABG Recent Labs  Lab 09/24/17 1835 09/24/17 2205 09/25/17 0429  PHART 7.201* 7.280* 7.384  PCO2ART 61.8* 50.2* 41.6  PO2ART 87.0 116* 105    Liver Enzymes Recent Labs  Lab 09/24/17 1740  AST 43*  ALT 20  ALKPHOS 64  BILITOT 0.8  ALBUMIN 3.4*    Cardiac Enzymes No results for input(s): TROPONINI, PROBNP in the last 168 hours.  Glucose Recent Labs  Lab 09/24/17 2140 09/24/17 2147 09/24/17 2209 09/25/17 0011 09/25/17 0335 09/25/17 0741  GLUCAP 44* 64* 111* 75 103* 134*    Imaging Ct Head Wo Contrast  Result Date: 09/24/2017 CLINICAL DATA:  Altered LOC EXAM: CT HEAD WITHOUT CONTRAST TECHNIQUE: Contiguous axial images were obtained from the base of the skull through  the vertex without intravenous contrast. COMPARISON:  None. FINDINGS: Brain: No evidence of acute infarction, hemorrhage, hydrocephalus, extra-axial collection or mass lesion/mass effect. Vascular: No hyperdense vessel or unexpected calcification. Skull: Normal. Negative for fracture or focal lesion. Sinuses/Orbits: Moderate mucosal thickening in the right maxillary sinus with mucosal thickening in the ethmoid sinuses. No acute orbital abnormality Other: None IMPRESSION: Negative non contrasted CT appearance of the brain.  Sinus disease Electronically Signed   By: Jasmine Pang M.D.   On: 09/24/2017 18:18   Dg Chest Port 1 View  Result Date: 09/25/2017 CLINICAL DATA:  Followup ventilator support.  ARDS. EXAM: PORTABLE CHEST 1 VIEW COMPARISON:  09/24/2017 FINDINGS: Endotracheal tube tip is 3 cm above the carina. Nasogastric tube enters the stomach. Widespread bilateral alveolar filling persists consistent with the clinical history of ARDS and or pneumonia. No new finding. No effusion or pneumothorax. IMPRESSION: Similar appearance. Widespread airspace filling consistent with ARDS or pneumonia. Electronically Signed   By: Paulina Fusi M.D.   On: 09/25/2017 07:03   Dg Chest Portable 1 View  Result Date: 09/24/2017 CLINICAL DATA:  Intubated EXAM: PORTABLE CHEST 1 VIEW COMPARISON:  08/29/2017 FINDINGS: Endotracheal tube tip is about 3.5 cm superior to the carina. Esophageal tube tip is below the diaphragm but non included. Borderline cardiomegaly. Extensive airspace disease bilaterally. No pneumothorax. IMPRESSION: 1. Endotracheal tube tip about 3.5 cm superior to carina. Esophageal tube tip below diaphragm but non included 2. Extensive bilateral airspace disease which may be secondary to edema, diffuse pneumonia, or ARDS. Electronically Signed   By: Jasmine Pang M.D.   On: 09/24/2017 17:17     STUDIES:  CT head 3/28 > nonacute  CULTURES: Blood 3/28 > Urine 3/28 > Tracheal aspirate 3/28 >  ANTIBIOTICS: Zosyn 3/28 >  Vancomycin 3/28 >   SIGNIFICANT EVENTS: Admitted 3/28   LINES/TUBES: ETT 3/28 >  DISCUSSION: 47 year old male admitted after a suspected heroin overdose. Emergently intubated for hypoxia. Radiography concerning for ARDS, requiring high PEEP and FiO2. Suspected aspiration.   ASSESSMENT / PLAN:  Acute hypoxic respiratory failure in the setting of suspected aspiration pneumonitis with resultant ARDS Chest x-ray personally reviewed: Demonstrates diffuse pulmonary infiltrates however aeration somewhat improved in comparison to prior film  endotracheal tubes in satisfactory position Weaning PEEP/FiO2 Plan Continue to wean PEEP/FiO2 per protocol PAD protocol RASS goal -1 Trend pro-calcitonin Day #2 vancomycin and Zosyn: Narrow antibiotics as cultures dictate Consider diuresis once hemodynamics will allow   Septic shock secondary to aspiration pneumonia -Lactic acid had cleared Plan Antibiotics per above Completing lactated Ringer's bolus, will check stroke volume responsiveness to next fluid challenge May need central access and norepinephrine; map goal greater than 65  Mild AKI Serum creatinine had improved with IV fluid resuscitation in the emergency room and following admission to the intensive care.  Currently hypotensive putting him at risk for recurrent kidney injury Plan Ensure adequate hydration Mean arterial pressure goal greater than 65 Renal dose medications Strict intake and output Follow-up a.m. chemistry  Fluid and electrolyte imbalance: Hypomagnesemia Plan Replace and recheck in a.m.   Acute toxic vs metabolic encephalopathy -Suspected unintentional overdose.  Mental status now improved Plan PAD protocol RASS goal -1  FAMILY  - Updates: Mother and sister updated. Significant other not to be updated. 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   - Inter-disciplinary family meet or Palliative Care meeting due by: 4/6   DVT prophylaxis: [place] heparin SUP: H2B Diet: start tubefeeds Activity: BR Disposition :  ICu   My ccm time 45 minutes  Sister updated Simonne MartinetPeter E Teryl Gubler ACNP-BC [facility]Critical Care Pager # [phone] OR # [phone] if no answer    09/25/2017 9:33 AM

## 2017-09-25 NOTE — Progress Notes (Signed)
This was a follow up visit to continue support to patient and family.  Sisters at bedside.  Patient is alert and progressing.  Provided praye, and emotional support.  Will follow as needed.  Ricardo Hale, Ricardo Hale, [place], [facility], Pager [phone]

## 2017-09-26 ENCOUNTER — Inpatient Hospital Stay (HOSPITAL_COMMUNITY): Payer: Self-pay

## 2017-09-26 DIAGNOSIS — I509 Heart failure, unspecified: Secondary | ICD-10-CM

## 2017-09-26 LAB — RESPIRATORY PANEL BY PCR
Adenovirus: NOT DETECTED
BORDETELLA PERTUSSIS-RVPCR: NOT DETECTED
CORONAVIRUS OC43-RVPPCR: NOT DETECTED
Chlamydophila pneumoniae: NOT DETECTED
Coronavirus 229E: NOT DETECTED
Coronavirus HKU1: NOT DETECTED
Coronavirus NL63: NOT DETECTED
INFLUENZA A-RVPPCR: NOT DETECTED
INFLUENZA B-RVPPCR: NOT DETECTED
Metapneumovirus: NOT DETECTED
Mycoplasma pneumoniae: NOT DETECTED
PARAINFLUENZA VIRUS 1-RVPPCR: NOT DETECTED
PARAINFLUENZA VIRUS 2-RVPPCR: NOT DETECTED
PARAINFLUENZA VIRUS 4-RVPPCR: NOT DETECTED
Parainfluenza Virus 3: NOT DETECTED
RESPIRATORY SYNCYTIAL VIRUS-RVPPCR: NOT DETECTED
RHINOVIRUS / ENTEROVIRUS - RVPPCR: NOT DETECTED

## 2017-09-26 LAB — CBC WITH DIFFERENTIAL/PLATELET
Basophils Absolute: 0 10*3/uL (ref 0.0–0.1)
Basophils Relative: 0 %
EOS PCT: 2 %
Eosinophils Absolute: 0.2 10*3/uL (ref 0.0–0.7)
HEMATOCRIT: 33.7 % — AB (ref 39.0–52.0)
Hemoglobin: 11.4 g/dL — ABNORMAL LOW (ref 13.0–17.0)
LYMPHS ABS: 1.8 10*3/uL (ref 0.7–4.0)
LYMPHS PCT: 13 %
MCH: 29.8 pg (ref 26.0–34.0)
MCHC: 33.8 g/dL (ref 30.0–36.0)
MCV: 88 fL (ref 78.0–100.0)
MONO ABS: 0.8 10*3/uL (ref 0.1–1.0)
MONOS PCT: 6 %
NEUTROS ABS: 10.9 10*3/uL — AB (ref 1.7–7.7)
Neutrophils Relative %: 79 %
PLATELETS: 205 10*3/uL (ref 150–400)
RBC: 3.83 MIL/uL — ABNORMAL LOW (ref 4.22–5.81)
RDW: 14 % (ref 11.5–15.5)
WBC: 13.8 10*3/uL — ABNORMAL HIGH (ref 4.0–10.5)

## 2017-09-26 LAB — GLUCOSE, CAPILLARY
GLUCOSE-CAPILLARY: 97 mg/dL (ref 65–99)
GLUCOSE-CAPILLARY: 177 mg/dL — AB (ref 65–99)
Glucose-Capillary: 113 mg/dL — ABNORMAL HIGH (ref 65–99)
Glucose-Capillary: 150 mg/dL — ABNORMAL HIGH (ref 65–99)
Glucose-Capillary: 159 mg/dL — ABNORMAL HIGH (ref 65–99)
Glucose-Capillary: 169 mg/dL — ABNORMAL HIGH (ref 65–99)

## 2017-09-26 LAB — ECHOCARDIOGRAM COMPLETE
Height: 71 in
WEIGHTICAEL: 3283.97 [oz_av]

## 2017-09-26 LAB — COMPREHENSIVE METABOLIC PANEL
ALBUMIN: 2.8 g/dL — AB (ref 3.5–5.0)
ALT: 14 U/L — AB (ref 17–63)
AST: 18 U/L (ref 15–41)
Alkaline Phosphatase: 47 U/L (ref 38–126)
Anion gap: 7 (ref 5–15)
BUN: 14 mg/dL (ref 6–20)
CHLORIDE: 107 mmol/L (ref 101–111)
CO2: 24 mmol/L (ref 22–32)
CREATININE: 1.07 mg/dL (ref 0.61–1.24)
Calcium: 8.3 mg/dL — ABNORMAL LOW (ref 8.9–10.3)
GFR calc Af Amer: >60 mL/min (ref >60)
GLUCOSE: 159 mg/dL — AB (ref 65–99)
POTASSIUM: 3.3 mmol/L — AB (ref 3.5–5.1)
Sodium: 138 mmol/L (ref 135–145)
Total Bilirubin: 0.9 mg/dL (ref 0.3–1.2)
Total Protein: 5.3 g/dL — ABNORMAL LOW (ref 6.5–8.1)

## 2017-09-26 LAB — PROCALCITONIN: PROCALCITONIN: 6.55 ng/mL

## 2017-09-26 LAB — STREP PNEUMONIAE URINARY ANTIGEN: Strep Pneumo Urinary Antigen: NEGATIVE

## 2017-09-26 LAB — SEDIMENTATION RATE: SED RATE: 53 mm/h — AB (ref 0–16)

## 2017-09-26 LAB — PHOSPHORUS: Phosphorus: 1.4 mg/dL — ABNORMAL LOW (ref 2.5–4.6)

## 2017-09-26 LAB — MAGNESIUM: Magnesium: 2.1 mg/dL (ref 1.7–2.4)

## 2017-09-26 MED ORDER — POTASSIUM CHLORIDE 20 MEQ/15ML (10%) PO SOLN
20.0000 meq | ORAL | Status: AC
  Administered 2017-09-26 (×2): 20 meq
  Filled 2017-09-26 (×2): qty 15

## 2017-09-26 MED ORDER — FUROSEMIDE 10 MG/ML IJ SOLN
40.0000 mg | Freq: Four times a day (QID) | INTRAMUSCULAR | Status: DC
  Administered 2017-09-26 – 2017-09-27 (×3): 40 mg via INTRAVENOUS
  Filled 2017-09-26 (×3): qty 4

## 2017-09-26 MED ORDER — POTASSIUM PHOSPHATES 15 MMOLE/5ML IV SOLN
20.0000 mmol | Freq: Once | INTRAVENOUS | Status: AC
  Administered 2017-09-26: 20 mmol via INTRAVENOUS
  Filled 2017-09-26: qty 6.67

## 2017-09-26 MED ORDER — DEXMEDETOMIDINE HCL IN NACL 400 MCG/100ML IV SOLN
0.0000 ug/kg/h | INTRAVENOUS | Status: DC
  Administered 2017-09-26: 0.6 ug/kg/h via INTRAVENOUS
  Administered 2017-09-26: 1.2 ug/kg/h via INTRAVENOUS
  Administered 2017-09-26: 0.6 ug/kg/h via INTRAVENOUS
  Administered 2017-09-26: 1 ug/kg/h via INTRAVENOUS
  Administered 2017-09-27 (×2): 0.6 ug/kg/h via INTRAVENOUS
  Filled 2017-09-26 (×6): qty 100

## 2017-09-26 MED ORDER — POTASSIUM CHLORIDE 10 MEQ/100ML IV SOLN
10.0000 meq | INTRAVENOUS | Status: AC
  Administered 2017-09-26 (×4): 10 meq via INTRAVENOUS
  Filled 2017-09-26 (×4): qty 100

## 2017-09-26 MED ORDER — FUROSEMIDE 10 MG/ML IJ SOLN
40.0000 mg | Freq: Once | INTRAMUSCULAR | Status: AC
  Administered 2017-09-26: 40 mg via INTRAVENOUS
  Filled 2017-09-26: qty 4

## 2017-09-26 MED ORDER — ACETAMINOPHEN 160 MG/5ML PO SOLN
650.0000 mg | ORAL | Status: DC | PRN
  Administered 2017-09-26: 650 mg
  Filled 2017-09-26: qty 20.3

## 2017-09-26 MED ORDER — POTASSIUM CHLORIDE 20 MEQ/15ML (10%) PO SOLN
40.0000 meq | Freq: Two times a day (BID) | ORAL | Status: DC
  Administered 2017-09-26 – 2017-09-27 (×3): 40 meq
  Filled 2017-09-26 (×3): qty 30

## 2017-09-26 NOTE — Progress Notes (Signed)
PULMONARY / CRITICAL CARE MEDICINE   Name: Davyn Morandi MRN: [id number] DOB: May 05, 1971    ADMISSION DATE:  09/24/2017 CONSULTATION DATE:  09/24/2017  REFERRING MD:  Dr. Silverio Lay EDP  CHIEF COMPLAINT:  ARDS  HISTORY OF PRESENT ILLNESS:   47 year old male with PMH significant for opioid overdose in past with multiple admissions for this (thought to be heroin). He lives with his significant other. She was not at bedside. His family who was at bedside (mom and sister) saw him last 3/24 at church. He was fine and had no complaints. On 3/28 he was at home and was on his own for four hours. When he was found he was unresponsive. Upon EMS arrival he had agonal respirations, which significantly improved with narcan administration. He remained unresponsive. He was placed on supplemental O2 for hypoxia. Upon arrival to ED he remained unresponsive with O2 sat 70% on NRB. He was emergently intubated. He required high PEEP and FiO2. CXR concerning for ARDS. PCCM asked to admit.   Previous ER notes with pt report of snorting heroin .    SUBJECTIVE:  Pt alert and f/c . Increased wob and frothy pink/red sputum , tachynea and increased  gen edema   5L + since admit , good UOP wt up 5-10 lb since admit .   VENTILATOR SETTINGS: Vent Mode: PRVC FiO2 (%):  [40 %-60 %] 60 % Set Rate:  [30 bmp] 30 bmp Vt Set:  [400 mL] 400 mL PEEP:  [5 cmH20-8 cmH20] 8 cmH20 Plateau Pressure:  [11 cmH20-22 cmH20] 11 cmH20  INTAKE / OUTPUT:  Intake/Output Summary (Last 24 hours) at 09/26/2017 1146 Last data filed at 09/26/2017 1000 Gross per 24 hour  Intake 4762.82 ml  Output 2481 ml  Net 2281.82 ml     PHYSICAL EXAMINATION: General: ill appearing male on vent , resp distress  HEENT: [place]/AT  Pulmonary: scattered rhonchi and crackles bilaterally , large amt of frothy pink/red sputum  Cardiac: RRR , no m/r/g.  Abdomen: Soft /NT , BS +  Extremities/musculoskeletal: warm , intact gen 1+ edema  Neuro/psych: awake ,  agitation , f/c    LABS:  BMET Recent Labs  Lab 09/24/17 1740 09/25/17 0715 09/26/17 0040  NA 142 141 138  K 4.3 3.8 3.3*  CL 107 109 107  CO2 21* 23 24  BUN 14 12 14   CREATININE 1.52* 1.21 1.07  GLUCOSE 179* 125* 159*    Electrolytes Recent Labs  Lab 09/24/17 1740 09/25/17 0715 09/26/17 0040  CALCIUM 8.1* 8.0* 8.3*  MG  --  1.5* 2.1  PHOS  --  2.7 1.4*    CBC Recent Labs  Lab 09/24/17 1740 09/25/17 0715 09/26/17 0040  WBC 24.8* 22.2* 13.8*  HGB 14.4 12.3* 11.4*  HCT 44.5 36.6* 33.7*  PLT 249 222 205    Coag's No results for input(s): APTT, INR in the last 168 hours.  Sepsis Markers Recent Labs  Lab 09/24/17 1753 09/24/17 2229 09/25/17 0715 09/26/17 0040  LATICACIDVEN 6.16* 2.0* 1.6  --   PROCALCITON  --   --  9.46 6.55    ABG Recent Labs  Lab 09/24/17 1835 09/24/17 2205 09/25/17 0429  PHART 7.201* 7.280* 7.384  PCO2ART 61.8* 50.2* 41.6  PO2ART 87.0 116* 105    Liver Enzymes Recent Labs  Lab 09/24/17 1740 09/26/17 0040  AST 43* 18  ALT 20 14*  ALKPHOS 64 47  BILITOT 0.8 0.9  ALBUMIN 3.4* 2.8*    Cardiac Enzymes No results  for input(s): TROPONINI, PROBNP in the last 168 hours.  Glucose Recent Labs  Lab 09/25/17 1154 09/25/17 1527 09/25/17 1956 09/26/17 0037 09/26/17 0452 09/26/17 0756  GLUCAP 120* 128* 151* 150* 169* 159*    Imaging Dg Chest Port 1 View  Result Date: 09/26/2017 CLINICAL DATA:  Pneumonia EXAM: PORTABLE CHEST 1 VIEW COMPARISON:  Yesterday FINDINGS: Endotracheal tube tip at the clavicular heads. An orogastric tube reaches the stomach. History of pneumonia/ARDS with extensive bilateral airspace disease. No visible effusion or air leak. Borderline heart size. IMPRESSION: 1. Stable positioning of endotracheal and orogastric tubes. 2. ARDS/pneumonia with unchanged extensive lung opacity. 3. Borderline heart size. Electronically Signed   By: Marnee SpringJonathon  Watts M.D.   On: 09/26/2017 07:56     STUDIES:  CT head  3/28 > nonacute  CULTURES: Blood 3/28 > Urine 3/28 >NEG  Tracheal aspirate 3/28 >  ANTIBIOTICS: Zosyn 3/28 >  Vancomycin 3/28 >   SIGNIFICANT EVENTS: Admitted 3/28   LINES/TUBES: ETT 3/28 >  DISCUSSION: 47 year old male admitted after a suspected heroin overdose. Emergently intubated for hypoxia. Radiography concerning for ARDS, requiring high PEEP and FiO2. Suspected aspiration.   ASSESSMENT / PLAN:  Acute hypoxic respiratory failure in the setting of suspected aspiration pneumonitis with resultant ARDS Increased wob and frothy pink mucus ? Pulm edema /vol overload  Plan Increased FiO2 and PEEP  PAD protocol RASS goal -1 Trend pro-calcitonin Day #3 vancomycin and Zosyn: Narrow antibiotics as cultures dictate Lasix 40mg  x 1 now  If blood tinged mucus persists may need to d/c hep , follow closely    Septic shock secondary to aspiration pneumonia -Lactic acid had cleared. B/p improved , not on pressors  HTN  Plan Antibiotics per above Decrease IVF to [facility].  Check 2 D echo    Mild AKI Improved with good UOP   Plan Diuresis as able  Renal dose medications Strict intake and output Follow-up a.m. chemistry  Fluid and electrolyte imbalance: Hypomagnesemia-resolved  Hypokalemia -replace 3/30  Plan Replace and recheck in a.m.   Acute toxic vs metabolic encephalopathy -Suspected unintentional overdose.  Mental status now improved Plan PAD protocol RASS goal 0 to - 1  Cont precedex   FAMILY  - Updates: Mother and sister updated. Significant other not to be updated. 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  3/30 , per RN , pt is married, taken off XXX status and wife to visit  Mother updated at bedside. 3/30  - Inter-disciplinary family meet or Palliative Care meeting due by: 4/6   DVT prophylaxis: [place] heparin SUP: H2B Diet: TF  Activity: BR Disposition : ICu          Evva Din NP-C  [place]  [phone]    09/26/2017  11:46 AM

## 2017-09-26 NOTE — Progress Notes (Signed)
[facility] : [facility]OCOL FOR AM LAB REPLACEMENT ONLY  The patient does apply for the [facility] Electrolyte Replac[facility] Protocol based on the criteria listed below:   1. Is GFR >/= 40 ml/min? Yes.    Patient's GFR today is >60 2. Is urine output >/= 0.5 ml/kg/hr for the last 6 hours? Yes.   Patient's UOP is 0.64 ml/kg/hr 3. Is BUN < 60 mg/dL? Yes.    Patient's BUN today is 14 4. Abnormal electrolyte(s): k+=3.3 5. Ordered repletion with: PER PROTOCOL 6. If a panic level lab has been reported, has the CCM MD in charge been notified? Yes.  .   Physician:  Oliver PilaPanchal  Ricardo Hale, Micki Rileybby Parker 09/26/2017 5:55 AM

## 2017-09-26 NOTE — Progress Notes (Signed)
Has LUE swelling  Plan Dc heparin due to alveolar hge Place SCD Check duplex UE  Dr. Kalman ShanMurali Kentarius Partington, M.D., [facility].P Pulmonary and Critical Care Medicine Staff Physician, [facility] Center Director - Interstitial Lung Disease  Program  Pulmonary Fibrosis [facility] [place], [place], [id number]  Pager: [phone], If no answer or between  15:00h - 7:00h: call [phone] Telephone: [phone]

## 2017-09-26 NOTE — Progress Notes (Signed)
  Echocardiogram 2D Echocardiogram has been performed.  Malyk Girouard G Bennetta Rudden 09/26/2017, 1:26 PM

## 2017-09-27 ENCOUNTER — Inpatient Hospital Stay (HOSPITAL_COMMUNITY): Payer: Self-pay

## 2017-09-27 DIAGNOSIS — E876 Hypokalemia: Secondary | ICD-10-CM

## 2017-09-27 DIAGNOSIS — R609 Edema, unspecified: Secondary | ICD-10-CM

## 2017-09-27 LAB — BASIC METABOLIC PANEL
Anion gap: 10 (ref 5–15)
BUN: 14 mg/dL (ref 6–20)
CALCIUM: 8.7 mg/dL — AB (ref 8.9–10.3)
CO2: 28 mmol/L (ref 22–32)
CREATININE: 1.15 mg/dL (ref 0.61–1.24)
Chloride: 102 mmol/L (ref 101–111)
GFR calc Af Amer: >60 mL/min (ref >60)
GFR calc non Af Amer: >60 mL/min (ref >60)
Glucose, Bld: 124 mg/dL — ABNORMAL HIGH (ref 65–99)
Potassium: 2.8 mmol/L — ABNORMAL LOW (ref 3.5–5.1)
SODIUM: 140 mmol/L (ref 135–145)

## 2017-09-27 LAB — CBC WITH DIFFERENTIAL/PLATELET
BASOS ABS: 0 10*3/uL (ref 0.0–0.1)
Basophils Relative: 0 %
EOS ABS: 0.3 10*3/uL (ref 0.0–0.7)
EOS PCT: 2 %
HCT: 35.1 % — ABNORMAL LOW (ref 39.0–52.0)
Hemoglobin: 12 g/dL — ABNORMAL LOW (ref 13.0–17.0)
Lymphocytes Relative: 11 %
Lymphs Abs: 1.9 10*3/uL (ref 0.7–4.0)
MCH: 29.6 pg (ref 26.0–34.0)
MCHC: 34.2 g/dL (ref 30.0–36.0)
MCV: 86.7 fL (ref 78.0–100.0)
MONO ABS: 1.7 10*3/uL — AB (ref 0.1–1.0)
Monocytes Relative: 10 %
Neutro Abs: 12.9 10*3/uL — ABNORMAL HIGH (ref 1.7–7.7)
Neutrophils Relative %: 77 %
PLATELETS: 208 10*3/uL (ref 150–400)
RBC: 4.05 MIL/uL — AB (ref 4.22–5.81)
RDW: 13.5 % (ref 11.5–15.5)
WBC: 16.8 10*3/uL — AB (ref 4.0–10.5)

## 2017-09-27 LAB — GLUCOSE, CAPILLARY
GLUCOSE-CAPILLARY: 106 mg/dL — AB (ref 65–99)
GLUCOSE-CAPILLARY: 146 mg/dL — AB (ref 65–99)
GLUCOSE-CAPILLARY: 169 mg/dL — AB (ref 65–99)
Glucose-Capillary: 204 mg/dL — ABNORMAL HIGH (ref 65–99)
Glucose-Capillary: 82 mg/dL (ref 65–99)
Glucose-Capillary: 136 mg/dL — ABNORMAL HIGH (ref 65–99)

## 2017-09-27 LAB — CULTURE, RESPIRATORY W GRAM STAIN

## 2017-09-27 LAB — PROCALCITONIN: PROCALCITONIN: 4.34 ng/mL

## 2017-09-27 LAB — MPO/PR-3 (ANCA) ANTIBODIES
ANCA Proteinase 3: <3.5 U/mL (ref 0.0–3.5)
Myeloperoxidase Abs: <9 U/mL (ref 0.0–9.0)

## 2017-09-27 LAB — CULTURE, RESPIRATORY: CULTURE: NORMAL

## 2017-09-27 LAB — HIV ANTIBODY (ROUTINE TESTING W REFLEX): HIV Screen 4th Generation wRfx: NONREACTIVE

## 2017-09-27 LAB — RHEUMATOID FACTOR: RHEUMATOID FACTOR: 17.7 [IU]/mL — AB (ref 0.0–13.9)

## 2017-09-27 LAB — PHOSPHORUS: Phosphorus: 2.4 mg/dL — ABNORMAL LOW (ref 2.5–4.6)

## 2017-09-27 LAB — MAGNESIUM: MAGNESIUM: 1.8 mg/dL (ref 1.7–2.4)

## 2017-09-27 LAB — LEGIONELLA PNEUMOPHILA SEROGP 1 UR AG: L. PNEUMOPHILA SEROGP 1 UR AG: NEGATIVE

## 2017-09-27 MED ORDER — POTASSIUM PHOSPHATES 15 MMOLE/5ML IV SOLN
30.0000 mmol | Freq: Once | INTRAVENOUS | Status: AC
  Administered 2017-09-27: 30 mmol via INTRAVENOUS
  Filled 2017-09-27: qty 10

## 2017-09-27 MED ORDER — POTASSIUM CHLORIDE 10 MEQ/100ML IV SOLN
10.0000 meq | INTRAVENOUS | Status: AC
  Administered 2017-09-27 (×4): 10 meq via INTRAVENOUS
  Filled 2017-09-27 (×4): qty 100

## 2017-09-27 MED ORDER — MAGNESIUM SULFATE 2 GM/50ML IV SOLN
2.0000 g | Freq: Once | INTRAVENOUS | Status: AC
  Administered 2017-09-27: 2 g via INTRAVENOUS
  Filled 2017-09-27: qty 50

## 2017-09-27 MED ORDER — POTASSIUM CHLORIDE CRYS ER 20 MEQ PO TBCR
40.0000 meq | EXTENDED_RELEASE_TABLET | Freq: Three times a day (TID) | ORAL | Status: AC
  Administered 2017-09-27 (×2): 40 meq via ORAL
  Filled 2017-09-27: qty 2

## 2017-09-27 MED ORDER — ORAL CARE MOUTH RINSE
15.0000 mL | Freq: Two times a day (BID) | OROMUCOSAL | Status: DC
  Administered 2017-09-27: 15 mL via OROMUCOSAL

## 2017-09-27 MED ORDER — POTASSIUM CHLORIDE CRYS ER 10 MEQ PO TBCR
EXTENDED_RELEASE_TABLET | ORAL | Status: AC
  Filled 2017-09-27: qty 4

## 2017-09-27 MED ORDER — FUROSEMIDE 10 MG/ML IJ SOLN
40.0000 mg | Freq: Three times a day (TID) | INTRAMUSCULAR | Status: AC
  Administered 2017-09-27 (×2): 40 mg via INTRAVENOUS
  Filled 2017-09-27 (×2): qty 4

## 2017-09-27 NOTE — Progress Notes (Signed)
eLink Physician-Brief Progress Note Patient Name: Ricardo DarbyScott Hale DOB: 1971/03/28 MRN: [id number]   Date of Service  09/27/2017  HPI/Events of Note  K+ = 2.8 and Creatinine = 1.15. K+ being replace with 40 meq BID, however, patient is being diuresed with Lasix every 6 hour.   eICU Interventions  Will cautously replace K+.      Intervention Category Major Interventions: Electrolyte abnormality - evaluation and management  Ricardo Hale Eugene 09/27/2017, 6:19 AM

## 2017-09-27 NOTE — Procedures (Signed)
Extubation Procedure Note  Patient Details:   Name: Ricardo DarbyScott Mihalic DOB: March 31, 1971 MRN: [id number]   Airway Documentation:     Evaluation  O2 sats: stable throughout Complications: No apparent complications Patient did tolerate procedure well. Bilateral Breath Sounds: Diminished   Yes.  Extubated per Dr's orders.  Pt stable and placed on 4L [place]  Dillan Lunden V 09/27/2017, 12:21 PM

## 2017-09-27 NOTE — Progress Notes (Signed)
PULMONARY / CRITICAL CARE MEDICINE   Name: Ricardo Hale MRN: [id number] DOB: [birth year]    ADMISSION DATE:  09/24/2017 CONSULTATION DATE:  09/24/2017  REFERRING MD:  Dr. Silverio Lay EDP  CHIEF COMPLAINT:  ARDS  HISTORY OF PRESENT ILLNESS:   47 year old male with PMH significant for opioid overdose in past with multiple admissions for this (thought to be heroin). He lives with his significant other. She was not at bedside. His family who was at bedside (mom and sister) saw him last 3/24 at church. He was fine and had no complaints. On 3/28 he was at home and was on his own for four hours. When he was found he was unresponsive. Upon EMS arrival he had agonal respirations, which significantly improved with narcan administration. He remained unresponsive. He was placed on supplemental O2 for hypoxia. Upon arrival to ED he remained unresponsive with O2 sat 70% on NRB. He was emergently intubated. He required high PEEP and FiO2. CXR concerning for ARDS. PCCM asked to admit.   Previous ER notes with pt report of snorting heroin .    SUBJECTIVE:  Hemoptysis resolved, no events and no new complaints  VENTILATOR SETTINGS: Vent Mode: PRVC FiO2 (%):  [40 %-60 %] 40 % Set Rate:  [30 bmp] 30 bmp Vt Set:  [400 mL] 400 mL PEEP:  [5 cmH20-8 cmH20] 5 cmH20 Plateau Pressure:  [11 cmH20-23 cmH20] 20 cmH20  INTAKE / OUTPUT:  Intake/Output Summary (Last 24 hours) at 09/27/2017 1122 Last data filed at 09/27/2017 1043 Gross per 24 hour  Intake 3740.53 ml  Output 8330 ml  Net -4589.47 ml     PHYSICAL EXAMINATION: General: Well appearing, NAD HEENT: [place]/AT, PERRL, EOM-I and MMM Pulmonary: Coarse BS diffusely Cardiac: RRR, Nl S1/S2 and -M/R/G Abdomen: Soft, NT, ND and +BS Extremities/musculoskeletal: warm, -edema and -tenderness Neuro/psych: calm, awake and interactive, moving all ext to command   LABS:  BMET Recent Labs  Lab 09/25/17 0715 09/26/17 0040 09/27/17 0503  NA 141 138 140  K 3.8 3.3*  2.8*  CL 109 107 102  CO2 23 24 28   BUN 12 14 14   CREATININE 1.21 1.07 1.15  GLUCOSE 125* 159* 124*    Electrolytes Recent Labs  Lab 09/25/17 0715 09/26/17 0040 09/27/17 0503  CALCIUM 8.0* 8.3* 8.7*  MG 1.5* 2.1 1.8  PHOS 2.7 1.4* 2.4*    CBC Recent Labs  Lab 09/25/17 0715 09/26/17 0040 09/27/17 0503  WBC 22.2* 13.8* 16.8*  HGB 12.3* 11.4* 12.0*  HCT 36.6* 33.7* 35.1*  PLT 222 205 208    Coag's No results for input(s): APTT, INR in the last 168 hours.  Sepsis Markers Recent Labs  Lab 09/24/17 1753 09/24/17 2229 09/25/17 0715 09/26/17 0040 09/27/17 0503  LATICACIDVEN 6.16* 2.0* 1.6  --   --   PROCALCITON  --   --  9.46 6.55 4.34    ABG Recent Labs  Lab 09/24/17 1835 09/24/17 2205 09/25/17 0429  PHART 7.201* 7.280* 7.384  PCO2ART 61.8* 50.2* 41.6  PO2ART 87.0 116* 105    Liver Enzymes Recent Labs  Lab 09/24/17 1740 09/26/17 0040  AST 43* 18  ALT 20 14*  ALKPHOS 64 47  BILITOT 0.8 0.9  ALBUMIN 3.4* 2.8*    Cardiac Enzymes No results for input(s): TROPONINI, PROBNP in the last 168 hours.  Glucose Recent Labs  Lab 09/26/17 1149 09/26/17 1608 09/26/17 2004 09/27/17 0018 09/27/17 0344 09/27/17 0801  GLUCAP 97 177* 113* 169* 146* 204*    Imaging  Dg Chest Port 1 View  Result Date: 09/27/2017 CLINICAL DATA:  Respiratory failure. EXAM: PORTABLE CHEST 1 VIEW COMPARISON:  09/26/2017 and prior radiographs FINDINGS: An endotracheal tube with tip 2.8 cm above the carina and NG tube in the stomach again noted. Bilateral airspace opacities have decreased. The cardiomediastinal silhouette is unchanged. There is no evidence of pneumothorax. IMPRESSION: Decreased bilateral airspace opacities/edema. Otherwise unchanged appearance of the chest. Electronically Signed   By: Harmon PierJeffrey  Hu M.D.   On: 09/27/2017 07:23     STUDIES:  CT head 3/28 > nonacute  CULTURES: Blood 3/28 > Urine 3/28 >NEG  Tracheal aspirate 3/28 >  ANTIBIOTICS: Zosyn 3/28 >   Vancomycin 3/28 >   SIGNIFICANT EVENTS: Admitted 3/28   LINES/TUBES: ETT 3/28 >  DISCUSSION: 47 year old male admitted after a suspected heroin overdose. Emergently intubated for hypoxia. Radiography concerning for ARDS, requiring high PEEP and FiO2. Suspected aspiration.   ASSESSMENT / PLAN:  Acute hypoxic respiratory failure in the setting of suspected aspiration pneumonitis with resultant ARDS Increased wob and frothy pink mucus ? Pulm edema /vol overload  Plan Begin PS trials and likely extubate later on today PAD protocol RASS goal -1 Trend pro-calcitonin Day #4 vancomycin and Zosyn: Narrow antibiotics as cultures dictate Lasix 40mg  q8 x2 doses  Septic shock secondary to aspiration pneumonia -Lactic acid had cleared. B/p improved , not on pressors  HTN  Plan Antibiotics per above KVO IVF D/C pressors  Mild AKI Improved with good UOP   Plan Lasix 40 mg IV q8 x2 doses Renal dose medications Strict intake and output Replace K, Mg and Phos today  Fluid and electrolyte imbalance: Hypomagnesemia-resolved  Hypokalemia -replace 3/30  Plan Replace and recheck in a.m.  Acute toxic vs metabolic encephalopathy -Suspected unintentional overdose.  Mental status now improved Plan PAD protocol RASS goal 0 to - 1  D/C precedex D/C sedatives  FAMILY  - Updates: Patient and family updated bedside  - Inter-disciplinary family meet or Palliative Care meeting due by: 4/6  The patient is critically ill with multiple organ systems failure and requires high complexity decision making for assessment and support, frequent evaluation and titration of therapies, application of advanced monitoring technologies and extensive interpretation of multiple databases.   Critical Care Time devoted to patient care services described in this note is  35  Minutes. This time reflects time of care of this signee Dr Koren BoundWesam Yacoub. This critical care time does not reflect procedure time, or  teaching time or supervisory time of PA/NP/Med student/Med Resident etc but could involve care discussion time.  Alyson ReedyWesam G. Yacoub, M.D. [facility]/Critical Care Medicine. Pager: [phone]. After hours pager: [phone].  09/27/2017 11:22 AM

## 2017-09-27 NOTE — Progress Notes (Signed)
VASCULAR LAB PRELIMINARY  PRELIMINARY  PRELIMINARY  PRELIMINARY  Bilateral upper extremity venous duplex completed.    Preliminary report:  There is no DVT noted in the bilateral upper extremities.  There is superficial thrombosis noted in the bilateral cephalic veins and in the right basilic vein.   Aayan Haskew, RVT 09/27/2017, 10:07 AM

## 2017-09-28 ENCOUNTER — Other Ambulatory Visit: Payer: Self-pay

## 2017-09-28 ENCOUNTER — Encounter (HOSPITAL_COMMUNITY): Payer: Self-pay

## 2017-09-28 LAB — GLOMERULAR BASEMENT MEMBRANE ANTIBODIES: GBM AB: 4 U (ref 0–20)

## 2017-09-28 LAB — CULTURE, RESPIRATORY W GRAM STAIN

## 2017-09-28 LAB — CBC WITH DIFFERENTIAL/PLATELET
Basophils Absolute: 0 10*3/uL (ref 0.0–0.1)
Basophils Relative: 0 %
EOS ABS: 0.2 10*3/uL (ref 0.0–0.7)
Eosinophils Relative: 1 %
HCT: 38.3 % — ABNORMAL LOW (ref 39.0–52.0)
HEMOGLOBIN: 13.2 g/dL (ref 13.0–17.0)
Lymphocytes Relative: 14 %
Lymphs Abs: 2.5 10*3/uL (ref 0.7–4.0)
MCH: 30 pg (ref 26.0–34.0)
MCHC: 34.5 g/dL (ref 30.0–36.0)
MCV: 87 fL (ref 78.0–100.0)
MONO ABS: 1.8 10*3/uL — AB (ref 0.1–1.0)
MONOS PCT: 10 %
NEUTROS PCT: 75 %
Neutro Abs: 13.7 10*3/uL — ABNORMAL HIGH (ref 1.7–7.7)
Platelets: 256 10*3/uL (ref 150–400)
RBC: 4.4 MIL/uL (ref 4.22–5.81)
RDW: 13.6 % (ref 11.5–15.5)
WBC: 18.2 10*3/uL — ABNORMAL HIGH (ref 4.0–10.5)

## 2017-09-28 LAB — SJOGRENS SYNDROME-B EXTRACTABLE NUCLEAR ANTIBODY: SSB (La) (ENA) Antibody, IgG: <0.2 AI (ref 0.0–0.9)

## 2017-09-28 LAB — SJOGRENS SYNDROME-A EXTRACTABLE NUCLEAR ANTIBODY: SSA (Ro) (ENA) Antibody, IgG: <0.2 AI (ref 0.0–0.9)

## 2017-09-28 LAB — ANTI-SCLERODERMA ANTIBODY: Scleroderma (Scl-70) (ENA) Antibody, IgG: <0.2 AI (ref 0.0–0.9)

## 2017-09-28 LAB — ANTI-DNA ANTIBODY, DOUBLE-STRANDED

## 2017-09-28 LAB — BASIC METABOLIC PANEL
ANION GAP: 14 (ref 5–15)
BUN: 13 mg/dL (ref 6–20)
CALCIUM: 8.8 mg/dL — AB (ref 8.9–10.3)
CO2: 26 mmol/L (ref 22–32)
Chloride: 98 mmol/L — ABNORMAL LOW (ref 101–111)
Creatinine, Ser: 1.07 mg/dL (ref 0.61–1.24)
GFR calc Af Amer: >60 mL/min (ref >60)
GFR calc non Af Amer: >60 mL/min (ref >60)
GLUCOSE: 94 mg/dL (ref 65–99)
POTASSIUM: 3.3 mmol/L — AB (ref 3.5–5.1)
Sodium: 138 mmol/L (ref 135–145)

## 2017-09-28 LAB — GLUCOSE, CAPILLARY
Glucose-Capillary: 71 mg/dL (ref 65–99)
Glucose-Capillary: 90 mg/dL (ref 65–99)

## 2017-09-28 LAB — MAGNESIUM: MAGNESIUM: 2.1 mg/dL (ref 1.7–2.4)

## 2017-09-28 LAB — PHOSPHORUS: Phosphorus: 3.6 mg/dL (ref 2.5–4.6)

## 2017-09-28 LAB — CULTURE, RESPIRATORY: CULTURE: NORMAL

## 2017-09-28 LAB — PROCALCITONIN: PROCALCITONIN: 2.08 ng/mL

## 2017-09-28 LAB — ANTINUCLEAR ANTIBODIES, IFA: ANTINUCLEAR ANTIBODIES, IFA: NEGATIVE

## 2017-09-28 MED ORDER — AMOXICILLIN-POT CLAVULANATE 875-125 MG PO TABS
1.0000 | ORAL_TABLET | Freq: Two times a day (BID) | ORAL | Status: DC
  Administered 2017-09-28 – 2017-09-29 (×3): 1 via ORAL
  Filled 2017-09-28 (×3): qty 1

## 2017-09-28 MED ORDER — POTASSIUM CHLORIDE CRYS ER 20 MEQ PO TBCR
40.0000 meq | EXTENDED_RELEASE_TABLET | Freq: Once | ORAL | Status: AC
  Administered 2017-09-28: 40 meq via ORAL
  Filled 2017-09-28: qty 2

## 2017-09-28 MED ORDER — AMLODIPINE BESYLATE 5 MG PO TABS
5.0000 mg | ORAL_TABLET | Freq: Every day | ORAL | Status: DC
Start: 2017-09-28 — End: 2017-09-29
  Administered 2017-09-28 – 2017-09-29 (×2): 5 mg via ORAL
  Filled 2017-09-28 (×2): qty 1

## 2017-09-28 MED ORDER — SODIUM CHLORIDE 0.9 % IV SOLN: INTRAVENOUS | Status: DC | PRN

## 2017-09-28 NOTE — Evaluation (Signed)
Physical Therapy Evaluation Patient Details Name: Ricardo DarbyScott Hale MRN: [id number] DOB: 05/13/1971 Today's Date: 09/28/2017   History of Present Illness  47 year old male admitted after a suspected heroin overdose, emergent intubation with subsequent extubation on 3/31.  Clinical Impression  Pt presents with overall functional mobility at or close to baseline at this time. Pt demonstrates transfers at an independent level and ambulation at a supervision level without an AD. BP 186/98 at start of session, 180/96 after ambulation (R calf in sitting), RN made aware. Anticipate pt will continue to improve with frequent functional mobility at home. No further need for acute PT services at this time.     Follow Up Recommendations No PT follow up    Equipment Recommendations  None recommended by PT    Recommendations for Other Services       Precautions / Restrictions Restrictions Weight Bearing Restrictions: No      Mobility  Bed Mobility               General bed mobility comments: pt in recliner upon PT arrival and returned to recliner at end of session  Transfers Overall transfer level: Independent Equipment used: None                Ambulation/Gait Ambulation/Gait assistance: Supervision Ambulation Distance (Feet): 350 Feet Assistive device: None Gait Pattern/deviations: Step-through pattern;Drifts right/left Gait velocity: decreased, able to increase with VCs   General Gait Details: pt with increased lateral sway and bil external rotation. no LOB.   Stairs            Wheelchair Mobility    Modified Rankin (Stroke Patients Only)       Balance Overall balance assessment: Modified Independent                           High level balance activites: Backward walking;Direction changes;Turns;Head turns;Sudden stops High Level Balance Comments: mild decreased in speed with head turns but otherwise no balance defecits noted              Pertinent Vitals/Pain Pain Assessment: No/denies pain    Home Living Family/patient expects to be discharged to:: Private residence Living Arrangements: Parent Available Help at Discharge: Family Type of Home: House Home Access: Level entry     Home Layout: One level Home Equipment: None Additional Comments: pt was living in an apartment, but plans to live with his mother upon d/c from hospital    Prior Function Level of Independence: Independent         Comments: works part time in [employer], [employer]     Hand Dominance   Dominant Hand: Right    Extremity/Trunk Assessment   Upper Extremity Assessment Upper Extremity Assessment: Overall WFL for tasks assessed    Lower Extremity Assessment Lower Extremity Assessment: Overall WFL for tasks assessed       Communication   Communication: No difficulties  Cognition Arousal/Alertness: Awake/alert Behavior During Therapy: WFL for tasks assessed/performed Overall Cognitive Status: Within Functional Limits for tasks assessed                                        General Comments General comments (skin integrity, edema, etc.): VSS    Exercises     Assessment/Plan    PT Assessment Patent does not need any further PT services  PT Problem List  PT Treatment Interventions      PT Goals (Current goals can be found in the Care Plan section)  Acute Rehab PT Goals Patient Stated Goal: to go home    Frequency     Barriers to discharge        Co-evaluation               AM-PAC PT "6 Clicks" Daily Activity  Outcome Measure Difficulty turning over in bed (including adjusting bedclothes, sheets and blankets)?: None Difficulty moving from lying on back to sitting on the side of the bed? : None Difficulty sitting down on and standing up from a chair with arms (e.g., wheelchair, bedside commode, etc,.)?: None Help needed moving to and from a bed to chair (including a wheelchair)?:  None Help needed walking in hospital room?: None Help needed climbing 3-5 steps with a railing? : None 6 Click Score: 24    End of Session Equipment Utilized During Treatment: Gait belt Activity Tolerance: Patient tolerated treatment well Patient left: in chair;with call bell/phone within reach Nurse Communication: Mobility status PT Visit Diagnosis: Other abnormalities of gait and mobility (R26.89)    Time: 1610-9604 PT Time Calculation (min) (ACUTE ONLY): 17 min   Charges:         PT G Codes:        Barrie Dunker, SPT  Barrie Dunker 09/28/2017, 1:09 PM

## 2017-09-28 NOTE — Progress Notes (Signed)
PULMONARY / CRITICAL CARE MEDICINE   Name: Ricardo Hale MRN: [id number] DOB: [birth year]    ADMISSION DATE:  09/24/2017 CONSULTATION DATE:  09/24/2017  REFERRING MD:  Dr. Silverio Lay EDP  CHIEF COMPLAINT:  ARDS  HISTORY OF PRESENT ILLNESS:   47 year old male with PMH significant for opioid overdose in past with multiple admissions for this (thought to be heroin). Presented 3/28 after being found unresponsive.  Upon EMS arrival he had agonal respirations, which significantly improved with narcan administration. but remained unresponsive. Upon arrival to ED he remained unresponsive with O2 sat 70% on NRB. He was emergently intubated. He required high PEEP and FiO2. CXR concerning for ARDS. PCCM asked to admit.    SUBJECTIVE:  Extubated yesterday. No c/o.     INTAKE / OUTPUT:  Intake/Output Summary (Last 24 hours) at 09/28/2017 0951 Last data filed at 09/28/2017 0400 Gross per 24 hour  Intake 1287.38 ml  Output 5333 ml  Net -4045.62 ml     PHYSICAL EXAMINATION: General: Well appearing, NAD HEENT: [place]/AT, PERRL, EOM-I and MMM Pulmonary: resps even non labored on RA, coarse Cardiac: RRR, Nl S1/S2 and -M/R/G Abdomen: Soft, NT, ND and +BS Extremities/musculoskeletal: warm, -edema and -tenderness Neuro/psych: pleasant, calm, awake, alert, appropriate, MAE    LABS:  BMET Recent Labs  Lab 09/26/17 0040 09/27/17 0503 09/28/17 0339  NA 138 140 138  K 3.3* 2.8* 3.3*  CL 107 102 98*  CO2 24 28 26   BUN 14 14 13   CREATININE 1.07 1.15 1.07  GLUCOSE 159* 124* 94    Electrolytes Recent Labs  Lab 09/26/17 0040 09/27/17 0503 09/28/17 0339  CALCIUM 8.3* 8.7* 8.8*  MG 2.1 1.8 2.1  PHOS 1.4* 2.4* 3.6    CBC Recent Labs  Lab 09/26/17 0040 09/27/17 0503 09/28/17 0339  WBC 13.8* 16.8* 18.2*  HGB 11.4* 12.0* 13.2  HCT 33.7* 35.1* 38.3*  PLT 205 208 256    Coag's No results for input(s): APTT, INR in the last 168 hours.  Sepsis Markers Recent Labs  Lab 09/24/17 1753  09/24/17 2229 09/25/17 0715 09/26/17 0040 09/27/17 0503  LATICACIDVEN 6.16* 2.0* 1.6  --   --   PROCALCITON  --   --  9.46 6.55 4.34    ABG Recent Labs  Lab 09/24/17 1835 09/24/17 2205 09/25/17 0429  PHART 7.201* 7.280* 7.384  PCO2ART 61.8* 50.2* 41.6  PO2ART 87.0 116* 105    Liver Enzymes Recent Labs  Lab 09/24/17 1740 09/26/17 0040  AST 43* 18  ALT 20 14*  ALKPHOS 64 47  BILITOT 0.8 0.9  ALBUMIN 3.4* 2.8*    Cardiac Enzymes No results for input(s): TROPONINI, PROBNP in the last 168 hours.  Glucose Recent Labs  Lab 09/27/17 0801 09/27/17 1157 09/27/17 1544 09/27/17 1959 09/27/17 2324 09/28/17 0258  GLUCAP 204* 82 106* 136* 71 90    Imaging No results found.   STUDIES:  CT head 3/28 > nonacute  CULTURES: Blood 3/28 > Urine 3/28 >NEG  Tracheal aspirate 3/28 >normal flora  RVP 3/28>>> neg   ANTIBIOTICS: Zosyn 3/28 >  Vancomycin 3/28 >   SIGNIFICANT EVENTS: Admitted 3/28   LINES/TUBES: ETT 3/28 >  DISCUSSION: 47 year old male admitted 3/28 after a suspected heroin overdose with possible aspiration +/- ARDS. Extubated 3/31.    ASSESSMENT / PLAN:  Acute hypoxic respiratory failure in the setting of suspected aspiration pneumonitis with resultant ARDS Increased wob and frothy pink mucus ? Pulm edema /vol overload  Plan Pulmonary hygiene  Wean O2 to off as able  F/u CXR in Am  Trend pct  Narrow abx to augmentin - plan 7 days total  Hold off on further diuresis for now - assess CXR in am    Septic shock secondary to aspiration pneumonia -Lactic acid had cleared. B/p improved , not on pressors  HTN  Plan Antibiotics per above KVO IVF On no home meds - monitor BP - may need to add anti-HTN on d/c   Mild AKI Improved with good UOP   Plan Hold further lasix for now  Strict intake and output Replace K, Mg and Phos today  Fluid and electrolyte imbalance: Hypomagnesemia-resolved  Hypokalemia -replace 3/30  Plan F/u chem    Acute toxic vs metabolic encephalopathy -Suspected unintentional overdose.  Mental status now improved Plan D/c sedation   FAMILY  - Updates: Patient updated bedside 4/1  - Inter-disciplinary family meet or Palliative Care meeting due by: 4/6  tx floor  Substance abuse counseling  Can likely d/c home in am 4/2   Dirk DressKaty Tiger Spieker, NP 09/28/2017  9:51 AM Pager: [phone] or [phone]

## 2017-09-29 ENCOUNTER — Inpatient Hospital Stay (HOSPITAL_COMMUNITY): Payer: Self-pay

## 2017-09-29 ENCOUNTER — Encounter (HOSPITAL_COMMUNITY): Payer: Self-pay

## 2017-09-29 LAB — CULTURE, BLOOD (ROUTINE X 2)
Culture: NO GROWTH
Culture: NO GROWTH
Special Requests: ADEQUATE
Special Requests: ADEQUATE

## 2017-09-29 LAB — BASIC METABOLIC PANEL
Anion gap: 11 (ref 5–15)
BUN: 13 mg/dL (ref 6–20)
CHLORIDE: 101 mmol/L (ref 101–111)
CO2: 27 mmol/L (ref 22–32)
CREATININE: 1.04 mg/dL (ref 0.61–1.24)
Calcium: 9.5 mg/dL (ref 8.9–10.3)
Glucose, Bld: 103 mg/dL — ABNORMAL HIGH (ref 65–99)
POTASSIUM: 3.9 mmol/L (ref 3.5–5.1)
SODIUM: 139 mmol/L (ref 135–145)

## 2017-09-29 LAB — ANCA TITERS
C-ANCA: <1:20 {titer}
P-ANCA: <1:20 {titer}

## 2017-09-29 LAB — CBC
HEMATOCRIT: 37.4 % — AB (ref 39.0–52.0)
Hemoglobin: 12.6 g/dL — ABNORMAL LOW (ref 13.0–17.0)
MCH: 29.4 pg (ref 26.0–34.0)
MCHC: 33.7 g/dL (ref 30.0–36.0)
MCV: 87.4 fL (ref 78.0–100.0)
PLATELETS: 291 10*3/uL (ref 150–400)
RBC: 4.28 MIL/uL (ref 4.22–5.81)
RDW: 13.6 % (ref 11.5–15.5)
WBC: 16.9 10*3/uL — AB (ref 4.0–10.5)

## 2017-09-29 LAB — CYCLIC CITRUL PEPTIDE ANTIBODY, IGG/IGA: CCP Antibodies IgG/IgA: 4 units (ref 0–19)

## 2017-09-29 MED ORDER — AMLODIPINE BESYLATE 5 MG PO TABS: 5.0000 mg | ORAL_TABLET | Freq: Every day | ORAL | 0 refills | Status: DC

## 2017-09-29 MED ORDER — AMOXICILLIN-POT CLAVULANATE 875-125 MG PO TABS
1.0000 | ORAL_TABLET | Freq: Two times a day (BID) | ORAL | 0 refills | Status: AC
Start: 2017-09-29 — End: 2017-10-04

## 2017-09-29 NOTE — Progress Notes (Signed)
PULMONARY / CRITICAL CARE MEDICINE   Name: Ricardo Hale MRN: [id number] DOB: 1970-07-09    ADMISSION DATE:  09/24/2017 CONSULTATION DATE:  09/24/2017  REFERRING MD:  Dr. Silverio LayYao EDP  CHIEF COMPLAINT:  ARDS  HISTORY OF PRESENT ILLNESS:   47 year old male smoker with PMH significant for opioid overdose in past with multiple admissions for this (thought to be heroin). Presented 3/28 after being found unresponsive.  Upon EMS arrival he had agonal respirations, which significantly improved with narcan administration. but remained unresponsive. Upon arrival to ED he remained unresponsive with O2 sat 70% on NRB. He was emergently intubated. He required high PEEP and FiO2. CXR concerning for ARDS. PCCM asked to admit.    SUBJECTIVE:  Extubated 3/31.  Doing well on RA. Slightly hypertensive. Awake and alert, following commands and appropriate    INTAKE / OUTPUT:  Intake/Output Summary (Last 24 hours) at 09/29/2017 0842 Last data filed at 09/29/2017 0800 Gross per 24 hour  Intake 595 ml  Output -  Net 595 ml     PHYSICAL EXAMINATION: General: Well appearing, NAD, sitting in chair HEENT: [place]/AT, PERRL, EOM-I and MMM Pulmonary: resps even non labored on RA, coarse to clear, no wheeze noted Cardiac: RRR, Nl S1/S2 and -M/R/G Abdomen: Soft, NT, ND and +BS, non-obese Extremities/musculoskeletal: warm, -edema and -tenderness good strength Neuro/psych: MAE x 4, appropriate   LABS:  BMET Recent Labs  Lab 09/27/17 0503 09/28/17 0339 09/29/17 0624  NA 140 138 139  K 2.8* 3.3* 3.9  CL 102 98* 101  CO2 28 26 27   BUN 14 13 13   CREATININE 1.15 1.07 1.04  GLUCOSE 124* 94 103*    Electrolytes Recent Labs  Lab 09/26/17 0040 09/27/17 0503 09/28/17 0339 09/29/17 0624  CALCIUM 8.3* 8.7* 8.8* 9.5  MG 2.1 1.8 2.1  --   PHOS 1.4* 2.4* 3.6  --     CBC Recent Labs  Lab 09/27/17 0503 09/28/17 0339 09/29/17 0624  WBC 16.8* 18.2* 16.9*  HGB 12.0* 13.2 12.6*  HCT 35.1* 38.3* 37.4*  PLT  208 256 291    Coag's No results for input(s): APTT, INR in the last 168 hours.  Sepsis Markers Recent Labs  Lab 09/24/17 1753 09/24/17 2229  09/25/17 0715 09/26/17 0040 09/27/17 0503 09/28/17 0339  LATICACIDVEN 6.16* 2.0*  --  1.6  --   --   --   PROCALCITON  --   --    < > 9.46 6.55 4.34 2.08   < > = values in this interval not displayed.    ABG Recent Labs  Lab 09/24/17 1835 09/24/17 2205 09/25/17 0429  PHART 7.201* 7.280* 7.384  PCO2ART 61.8* 50.2* 41.6  PO2ART 87.0 116* 105    Liver Enzymes Recent Labs  Lab 09/24/17 1740 09/26/17 0040  AST 43* 18  ALT 20 14*  ALKPHOS 64 47  BILITOT 0.8 0.9  ALBUMIN 3.4* 2.8*    Cardiac Enzymes No results for input(s): TROPONINI, PROBNP in the last 168 hours.  Glucose Recent Labs  Lab 09/27/17 0801 09/27/17 1157 09/27/17 1544 09/27/17 1959 09/27/17 2324 09/28/17 0258  GLUCAP 204* 82 106* 136* 71 90    Imaging Dg Chest 2 View  Result Date: 09/29/2017 CLINICAL DATA:  Unresponsive after heroin use, intubated EXAM: CHEST - 2 VIEW COMPARISON:  Portable chest x-ray of 09/27/2017 FINDINGS: The endotracheal tube has been removed. No pneumonia or effusion is seen. As questioned on the initial chest x-ray of 08/29/2017, there is still a nodular opacity  anteriorly between the right first and second ribs. This could be degenerative in origin, but a pulmonary nodule cannot be excluded. CT of the chest is recommended without IV contrast media. Mediastinal and hilar contours are unremarkable and the heart is within normal limits in size. No acute bony abnormality is seen. IMPRESSION: 1. No active lung disease.  Endotracheal tube removed. 2. Persistent questionable nodular lesion in the right upper lobe as described above. Possible bony in origin but cannot exclude pulmonary nodule. Recommend CT of the chest without IV contrast media. Electronically Signed   By: Dwyane Dee M.D.   On: 09/29/2017 08:12     STUDIES:  CT head 3/28 >  nonacute  CULTURES: Blood 3/28 > Urine 3/28 >NEG  Tracheal aspirate 3/28 >normal flora  RVP 3/28>>> neg   ANTIBIOTICS: Zosyn 3/28 >  Vancomycin 3/28 >   SIGNIFICANT EVENTS: Admitted 3/28   LINES/TUBES: ETT 3/28 >  DISCUSSION: 47 year old male admitted 3/28 after a suspected heroin overdose with possible aspiration +/- ARDS. Extubated 3/31. CXR with questionable RUL nodular lesion, possible bony origin / pulmonary nodule   ASSESSMENT / PLAN:  Acute hypoxic respiratory failure in the setting of suspected aspiration pneumonitis with resultant ARDS Increased wob and frothy pink mucus ? Pulm edema /vol overload  On RA this am with good sats Plan Aggressive Pulmonary hygiene  Oxygen prn Consider CT Chest ( Pulmonary nodule) Narrow abx to augmentin - plan 7 days total  Hold off on further diuresis for now - CXR without vascular congestion 4/2   Septic shock secondary to aspiration pneumonia -Lactic acid had cleared. B/p improved , not on pressors  HTN  Plan Antibiotics per above KVO IVF On no home meds - monitor BP - Norvasc started 4/1  Mild AKI Improved with good UOP Serum creatinine continues to trend down.  Plan Hold further lasix for now  Strict intake and output Replaced K, Mg and Phos 4/1 Follow up labs per PCP at discharge   Fluid and electrolyte imbalance: Hypomagnesemia-resolved  Hypokalemia -replace 3/30  Plan F/u chem prn and follow up labs per PCP at DC  Acute toxic vs metabolic encephalopathy -Suspected unintentional overdose.  Mental status now improved - Off sedation and calm Plan Monitor  FAMILY  - Updates: Patient updated bedside 4/2  - Inter-disciplinary family meet or Palliative Care meeting due by: 4/6  tx floor  Substance abuse counseling  Can likely d/c home in am 4/2   Bevelyn Ngo, AGACNP-BC [facility] CCM 09/29/2017  8:42 AM Pager:  [phone]

## 2017-09-29 NOTE — Discharge Summary (Signed)
Physician Discharge Summary       Patient ID: Ricardo Hale MRN: [id number] DOB/AGE: [birth year] 47 y.o.  Admit date: 09/24/2017 Discharge date: 09/29/2017  Discharge Diagnoses:  Active Problems:   ARDS (adult respiratory distress syndrome) (HCC)   Acute respiratory failure with hypoxia and hypercapnia (HCC)   History of Present Illness: 47 year old male smoker with PMH significant for opioid overdose in past with multiple admissions for this (thought to be heroin). Presented 3/28 after being found unresponsive.  Upon EMS arrival he had agonal respirations, which significantly improved with narcan administration. but remained unresponsive. Upon arrival to ED he remained unresponsive with O2 sat 70% on NRB. He was emergently intubated. He required high PEEP and FiO2. CXR concerning for ARDS. PCCM asked to admit.     Hospital Course:  Pt required intubation 3/28  for airway protection after  heroin drug OD>  He was extubated 3/31 and is saturating 100% on RA. CXR was concerning for aspiration +/- ARDS.CXR with questionable RUL nodular lesion, possible bony origin / pulmonary nodule. Discussed with patient he will need follow up imaging of that area to ensure it is not a malignancy ( Pt. Is a smoker). CXR has improved and patient will be discharged with augmentin - plan 7 days total . He has had HTN , and he has been started on Norvasc 5 mg daily which he has tolerated well.He has reached maximum  inpatient benefit and is ready for discharge home. He will need substance abuse counseling .        Discharge Plan by active problems   Acute hypoxic respiratory failure in the setting of suspected aspiration pneumonitis with resultant ARDS Increased wob and frothy pink mucus ? Pulm edema /vol overload  On RA this am with good sats Plan Aggressive Pulmonary hygiene  Wean oxygen to RA Consider CT Chest ( Pulmonary nodule) as outpatient for follow up Narrow abx to augmentin - plan 7 days total   Hold off on further diuresis for now - CXR without vascular congestion 4/2   Septic shock secondary to aspiration pneumonia -Lactic acid had cleared. B/p improved , not on pressors - CXR improved     HTN  Plan Antibiotics per above KVO IVF On no home meds - monitor BP - Norvasc started 4/1>> Will dc home with 30 days of Norvasc, will need to follow up with PCP for ongoing tx of HTN  Mild AKI Improved with good UOP Serum creatinine continues to trend down.  Plan No  further lasix   Follow up labs per PCP at discharge   Fluid and electrolyte imbalance: Hypomagnesemia-resolved  Hypokalemia -replace 3/30  Plan F/u chem prn and follow up labs per PCP at DC  Acute toxic vs metabolic encephalopathy -Suspected unintentional overdose.  Mental status now improved - Off sedation and calm  Plan Will need substance abuse counseling Monitor  FAMILY  - Updates: Patient updated bedside 4/2. Explained he will be discharged with antibiotic that needs to be completed and that he will have follow up at Pulmonary with a CXR. Additionally discussed he will be discharged with blood pressure medication that he will need to follow up with his PCP to manage. He verbalized understanding.        Significant Hospital tests/ studies  STUDIES:  CT head 3/28 > nonacute  CULTURES: Blood 3/28 > Urine 3/28 >NEG  Tracheal aspirate 3/28 >normal flora  RVP 3/28>>> neg   ANTIBIOTICS: Zosyn 3/28 >  Vancomycin 3/28 >  SIGNIFICANT EVENTS: Admitted 3/28   LINES/TUBES: ETT 3/28 >  Dg Chest 2 View  Result Date: 09/29/2017 CLINICAL DATA:  Unresponsive after heroin use, intubated EXAM: CHEST - 2 VIEW COMPARISON:  Portable chest x-ray of 09/27/2017 FINDINGS: The endotracheal tube has been removed. No pneumonia or effusion is seen. As questioned on the initial chest x-ray of 08/29/2017, there is still a nodular opacity anteriorly between the right first and second ribs. This could  be degenerative in origin, but a pulmonary nodule cannot be excluded. CT of the chest is recommended without IV contrast media. Mediastinal and hilar contours are unremarkable and the heart is within normal limits in size. No acute bony abnormality is seen. IMPRESSION: 1. No active lung disease.  Endotracheal tube removed. 2. Persistent questionable nodular lesion in the right upper lobe as described above. Possible bony in origin but cannot exclude pulmonary nodule. Recommend CT of the chest without IV contrast media. Electronically Signed   By: Dwyane Dee M.D.   On: 09/29/2017 08:12      Consults    Discharge Exam: BP (!) 185/100   Pulse 74   Temp 98.6 F (37 C) (Oral)   Resp 18   Ht 5\' 11"  (1.803 m)   Wt 183 lb 13.8 oz (83.4 kg)   SpO2 96%   BMI 25.64 kg/m   General: Well appearing, NAD, sitting in chair HEENT: [place]/AT, PERRL, EOM-I and MMM Pulmonary: resps even non labored on RA, coarse to clear, no wheeze noted Cardiac: RRR, Nl S1/S2 and -M/R/G Abdomen: Soft, NT, ND and +BS, non-obese Extremities/musculoskeletal: warm, -edema and -tenderness good strength Neuro/psych: MAE x 4, appropriate    Labs at discharge Lab Results  Component Value Date   CREATININE 1.04 09/29/2017   BUN 13 09/29/2017   NA 139 09/29/2017   K 3.9 09/29/2017   CL 101 09/29/2017   CO2 27 09/29/2017   Lab Results  Component Value Date   WBC 16.9 (H) 09/29/2017   HGB 12.6 (L) 09/29/2017   HCT 37.4 (L) 09/29/2017   MCV 87.4 09/29/2017   PLT 291 09/29/2017   Lab Results  Component Value Date   ALT 14 (L) 09/26/2017   AST 18 09/26/2017   ALKPHOS 47 09/26/2017   BILITOT 0.9 09/26/2017   No results found for: INR, PROTIME  Current radiology studies Dg Chest 2 View  Result Date: 09/29/2017 CLINICAL DATA:  Unresponsive after heroin use, intubated EXAM: CHEST - 2 VIEW COMPARISON:  Portable chest x-ray of 09/27/2017 FINDINGS: The endotracheal tube has been removed. No pneumonia or effusion is  seen. As questioned on the initial chest x-ray of 08/29/2017, there is still a nodular opacity anteriorly between the right first and second ribs. This could be degenerative in origin, but a pulmonary nodule cannot be excluded. CT of the chest is recommended without IV contrast media. Mediastinal and hilar contours are unremarkable and the heart is within normal limits in size. No acute bony abnormality is seen. IMPRESSION: 1. No active lung disease.  Endotracheal tube removed. 2. Persistent questionable nodular lesion in the right upper lobe as described above. Possible bony in origin but cannot exclude pulmonary nodule. Recommend CT of the chest without IV contrast media. Electronically Signed   By: Dwyane Dee M.D.   On: 09/29/2017 08:12    Disposition:  Home with family   Allergies as of 09/29/2017   No Known Allergies     Medication List    TAKE these medications   amLODipine  5 MG tablet Commonly known as:  NORVASC Take 1 tablet (5 mg total) by mouth daily. Start taking on:  09/30/2017   amoxicillin-clavulanate 875-125 MG tablet Commonly known as:  AUGMENTIN Take 1 tablet by mouth every 12 (twelve) hours for 5 days.      Advance activity as tolerated Complete all antibiotics as directed Daily blood pressure medication as above  Follow up with PCP for labs and blood pressure management Substance abuse counseling  Discharged Condition: good  Greater than 35 minutes of time have been dedicated to discharge assessment, planning and discharge instructions.   Signed: Bevelyn Ngo, AGACNP-BC [facility] Pager # [phone] 09/29/2017 10:02 AM

## 2017-10-12 ENCOUNTER — Inpatient Hospital Stay: Payer: Self-pay | Admitting: Acute Care

## 2018-06-28 IMAGING — DX DG CHEST 1V PORT
1 series · 1 of 1 positions shown · non-contrast
Comparison: 09/24/2017

CLINICAL DATA: Followup ventilator support.  ARDS.

EXAM:
PORTABLE CHEST 1 VIEW

[chest ap]
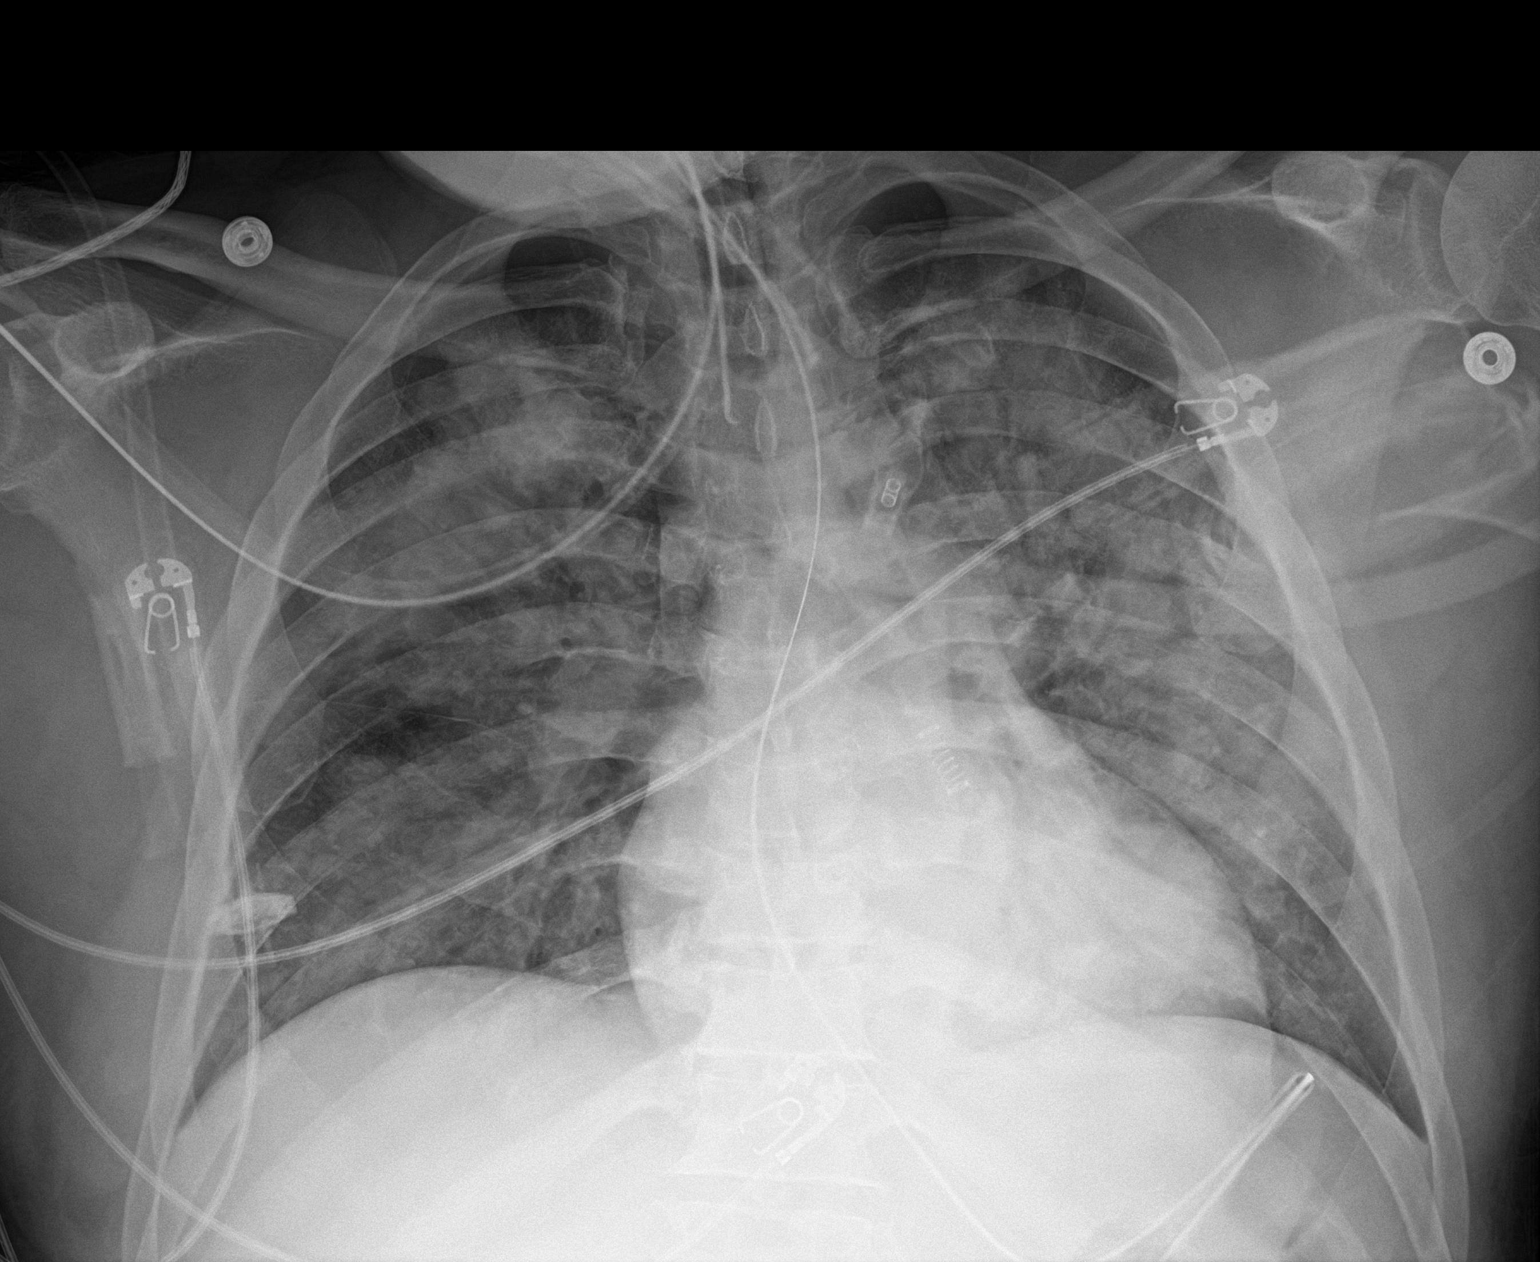

[1 of 1 positions shown; findings below may reference images not displayed]

FINDINGS: Endotracheal tube tip is 3 cm above the carina. Nasogastric tube
enters the stomach. Widespread bilateral alveolar filling persists
consistent with the clinical history of ARDS and or pneumonia. No
new finding. No effusion or pneumothorax.
IMPRESSION: Similar appearance. Widespread airspace filling consistent with ARDS
or pneumonia.

## 2018-06-29 IMAGING — DX DG CHEST 1V PORT
1 series · 1 of 1 positions shown · non-contrast
Comparison: Yesterday

CLINICAL DATA: Pneumonia

EXAM:
PORTABLE CHEST 1 VIEW

[chest ap]
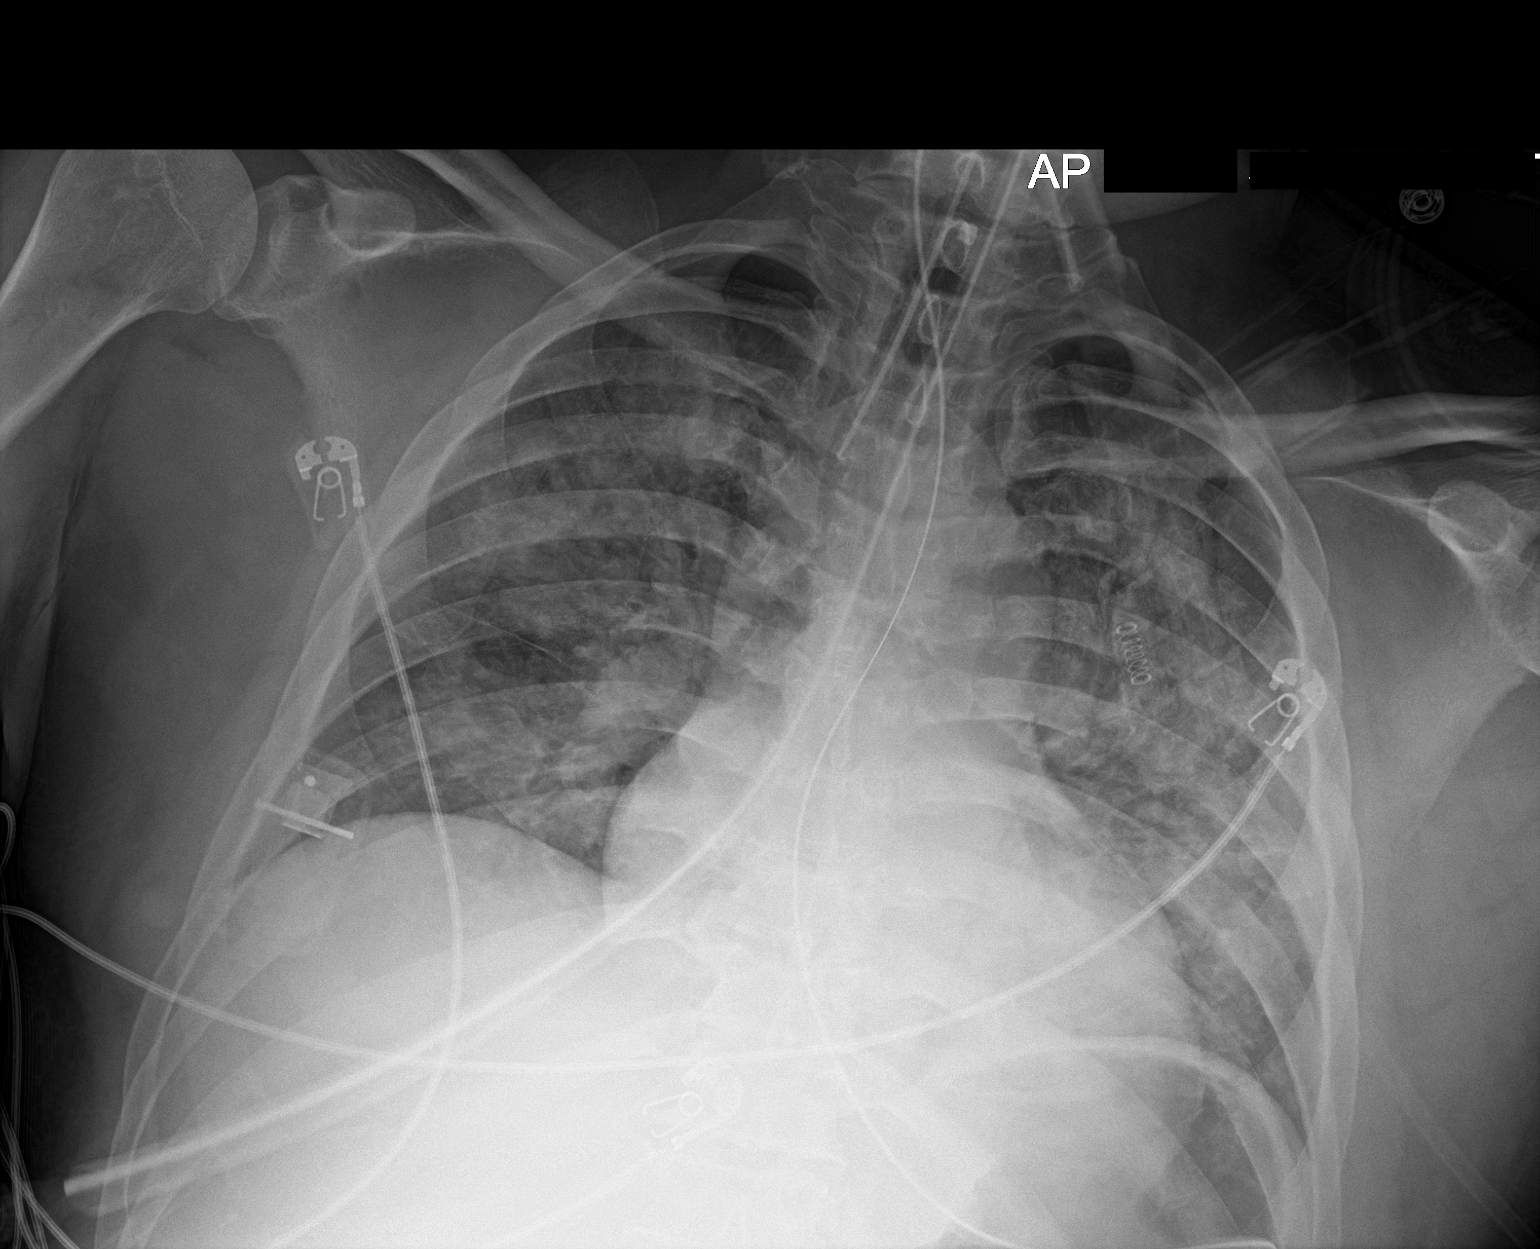

[1 of 1 positions shown; findings below may reference images not displayed]

FINDINGS: Endotracheal tube tip at the clavicular heads. An orogastric tube
reaches the stomach. History of pneumonia/ARDS with extensive
bilateral airspace disease. No visible effusion or air leak.
Borderline heart size.
IMPRESSION: 1. Stable positioning of endotracheal and orogastric tubes.
2. ARDS/pneumonia with unchanged extensive lung opacity.
3. Borderline heart size.

## 2018-06-30 IMAGING — DX DG CHEST 1V PORT
1 series · 1 of 1 positions shown · non-contrast
Comparison: 09/26/2017 and prior radiographs

CLINICAL DATA: Respiratory failure.

EXAM:
PORTABLE CHEST 1 VIEW

[chest ap]
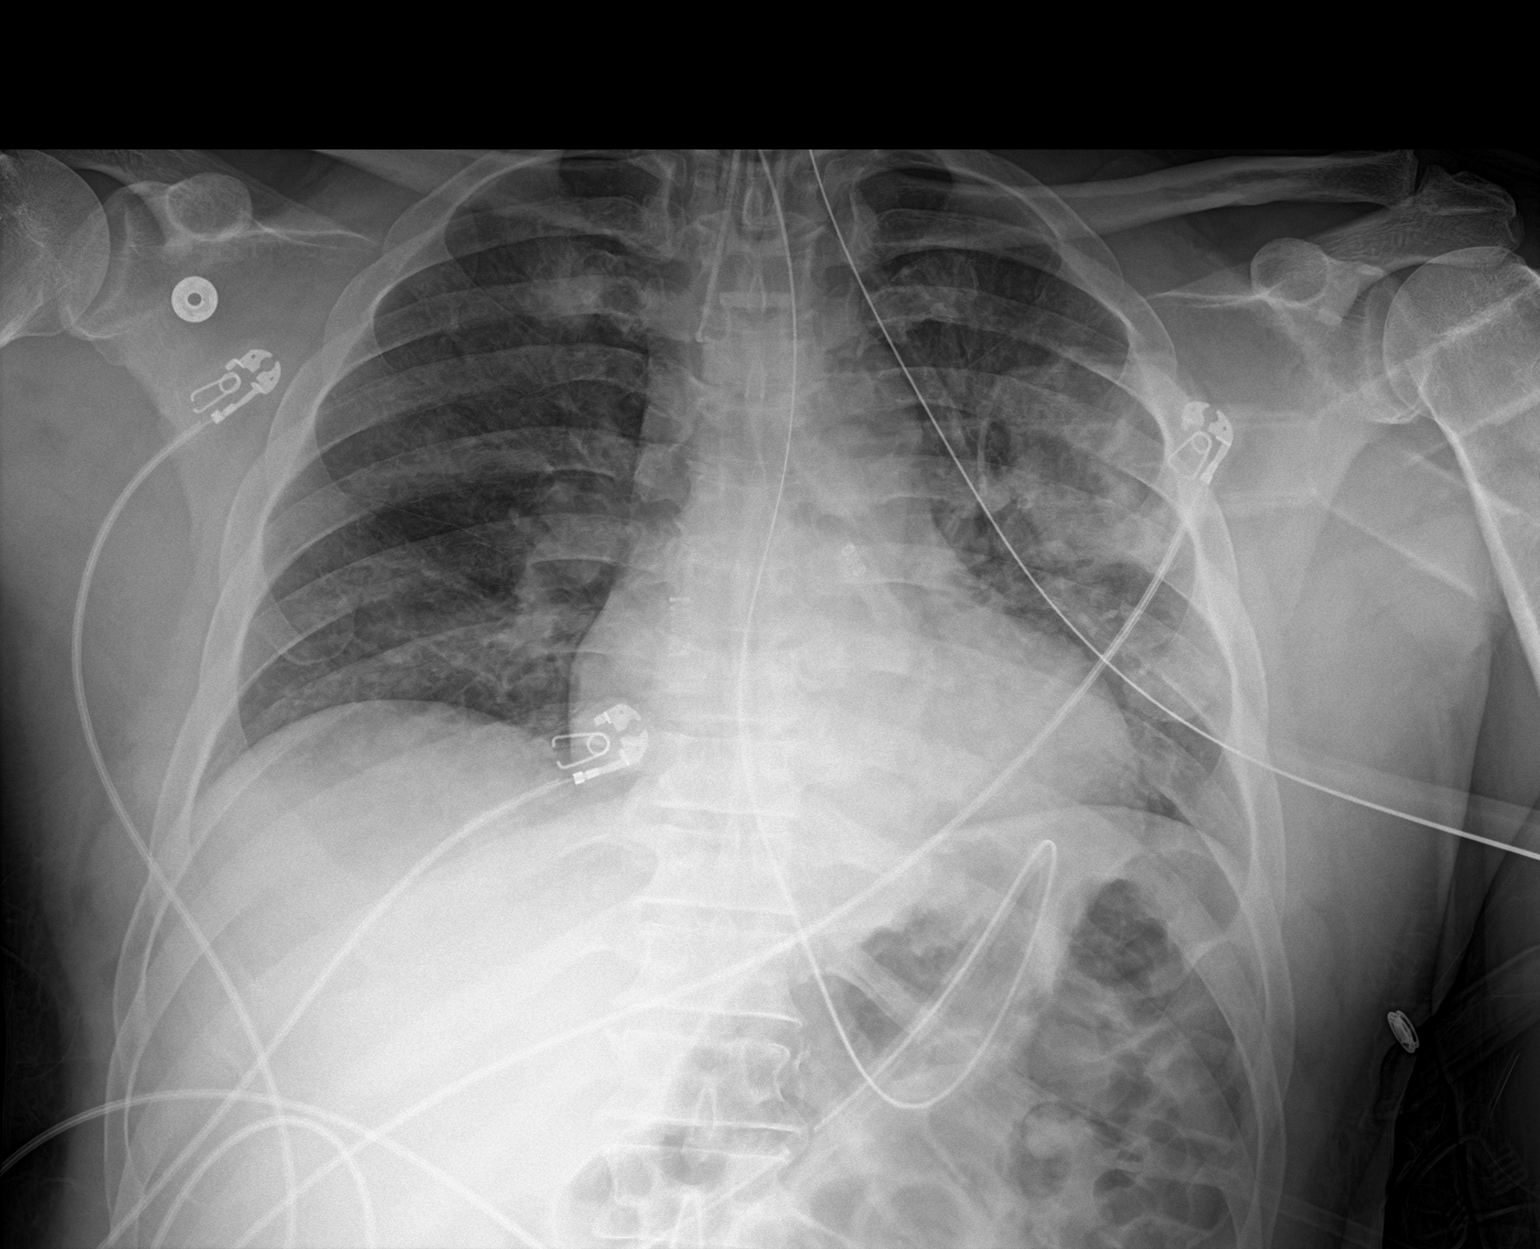

[1 of 1 positions shown; findings below may reference images not displayed]

FINDINGS: An endotracheal tube with tip 2.8 cm above the carina and NG tube in
the stomach again noted.

Bilateral airspace opacities have decreased.

The cardiomediastinal silhouette is unchanged.

There is no evidence of pneumothorax.
IMPRESSION: Decreased bilateral airspace opacities/edema. Otherwise unchanged
appearance of the chest.

## 2024-04-04 ENCOUNTER — Other Ambulatory Visit: Payer: Self-pay

## 2024-04-04 ENCOUNTER — Emergency Department (HOSPITAL_COMMUNITY): Admission: EM | Admit: 2024-04-04 | Discharge: 2024-04-05 | Discharge status: Against Medical Advice | Payer: Self-pay

## 2024-04-04 ENCOUNTER — Emergency Department (HOSPITAL_COMMUNITY): Payer: Self-pay

## 2024-04-04 ENCOUNTER — Encounter (HOSPITAL_COMMUNITY): Payer: Self-pay

## 2024-04-04 DIAGNOSIS — M79652 Pain in left thigh: Secondary | ICD-10-CM | POA: Insufficient documentation

## 2024-04-04 DIAGNOSIS — Z4800 Encounter for change or removal of nonsurgical wound dressing: Secondary | ICD-10-CM | POA: Insufficient documentation

## 2024-04-04 DIAGNOSIS — M7989 Other specified soft tissue disorders: Secondary | ICD-10-CM | POA: Insufficient documentation

## 2024-04-04 DIAGNOSIS — Z5321 Procedure and treatment not carried out due to patient leaving prior to being seen by health care provider: Secondary | ICD-10-CM | POA: Insufficient documentation

## 2024-04-04 LAB — CBC WITH DIFFERENTIAL/PLATELET
Abs Immature Granulocytes: 0.15 K/uL — ABNORMAL HIGH (ref 0.00–0.07)
Basophils Absolute: 0.1 K/uL (ref 0.0–0.1)
Basophils Relative: 0 %
Eosinophils Absolute: 0.2 K/uL (ref 0.0–0.5)
Eosinophils Relative: 1 %
HCT: 46.7 % (ref 39.0–52.0)
Hemoglobin: 15.7 g/dL (ref 13.0–17.0)
Immature Granulocytes: 1 %
Lymphocytes Relative: 16 %
Lymphs Abs: 2.3 K/uL (ref 0.7–4.0)
MCH: 31 pg (ref 26.0–34.0)
MCHC: 33.6 g/dL (ref 30.0–36.0)
MCV: 92.1 fL (ref 80.0–100.0)
Monocytes Absolute: 1.2 K/uL — ABNORMAL HIGH (ref 0.1–1.0)
Monocytes Relative: 9 %
Neutro Abs: 10.2 K/uL — ABNORMAL HIGH (ref 1.7–7.7)
Neutrophils Relative %: 73 %
Platelets: 335 K/uL (ref 150–400)
RBC: 5.07 MIL/uL (ref 4.22–5.81)
RDW: 13.3 % (ref 11.5–15.5)
WBC: 14.1 K/uL — ABNORMAL HIGH (ref 4.0–10.5)
nRBC: 0 % (ref 0.0–0.2)

## 2024-04-04 LAB — BASIC METABOLIC PANEL WITH GFR
Anion gap: 13 (ref 5–15)
BUN: 16 mg/dL (ref 6–20)
CO2: 25 mmol/L (ref 22–32)
Calcium: 9.2 mg/dL (ref 8.9–10.3)
Chloride: 99 mmol/L (ref 98–111)
Creatinine, Ser: 1.31 mg/dL — ABNORMAL HIGH (ref 0.61–1.24)
GFR, Estimated: >60 mL/min (ref >60)
Glucose, Bld: 113 mg/dL — ABNORMAL HIGH (ref 70–99)
Potassium: 3.5 mmol/L (ref 3.5–5.1)
Sodium: 137 mmol/L (ref 135–145)

## 2024-04-04 LAB — I-STAT CG4 LACTIC ACID, ED: Lactic Acid, Venous: 1.9 mmol/L (ref 0.5–1.9)

## 2024-04-04 MED ORDER — OXYCODONE-ACETAMINOPHEN 5-325 MG PO TABS
2.0000 | ORAL_TABLET | Freq: Once | ORAL | Status: AC
  Administered 2024-04-04: 2 via ORAL
  Filled 2024-04-04: qty 2

## 2024-04-04 NOTE — ED Provider Triage Note (Signed)
 Emergency Medicine Provider Triage Evaluation Note  Ricardo Hale , a 53 y.o. male  was evaluated in triage.  Pt complains of pain, swelling and tenderness on the anterior left thigh.  Has drainage from the same, states it started as a small lesion that he thought was a mosquito bite several days prior.  Afebrile.  Review of Systems  Positive: As above Negative:   Physical Exam  BP (!) 153/88   Pulse 80   Temp 98.5 F (36.9 C)   Resp 18   Ht 5' 6.5 (1.689 m)   Wt 81.6 kg   SpO2 100%   BMI 28.62 kg/m  Gen:   Awake, Hale distress   Resp:  Normal effort  MSK:   Moves extremities without difficulty  Other:  Noted tender erythematous indurated nodule to the left anterior thigh.  There is a central punctum with purulent drainage expressed.  There is Hale noted fluctuance in the mass.  Medical Decision Making  Medically screening exam initiated at 7:37 PM.  Appropriate orders placed.  Ricardo Hale was informed that the remainder of the evaluation will be completed by another provider, this initial triage assessment does not replace that evaluation, and the importance of remaining in the ED until their evaluation is complete.  Initial labs ordered.  Pain management given.   Ricardo Hale, Ricardo Hale 04/04/24 [phone]

## 2024-04-04 NOTE — ED Triage Notes (Signed)
 Pt presents with wound to left anterior upper thigh that he noticed on Saturday. Wound is open and draining purulent drainage with peri-wound erythema.

## 2024-04-05 NOTE — ED Notes (Signed)
 Pt left d/t wait time

## 2024-04-06 ENCOUNTER — Ambulatory Visit
Admission: EM | Admit: 2024-04-06 | Discharge: 2024-04-06 | Disposition: A | Discharge status: Home / Self Care | Payer: Self-pay | Attending: Nurse Practitioner | Admitting: Nurse Practitioner

## 2024-04-06 ENCOUNTER — Encounter: Payer: Self-pay | Admitting: Emergency Medicine

## 2024-04-06 DIAGNOSIS — I10 Essential (primary) hypertension: Secondary | ICD-10-CM

## 2024-04-06 DIAGNOSIS — T148XXA Other injury of unspecified body region, initial encounter: Secondary | ICD-10-CM

## 2024-04-06 DIAGNOSIS — L089 Local infection of the skin and subcutaneous tissue, unspecified: Secondary | ICD-10-CM

## 2024-04-06 MED ORDER — AMLODIPINE BESYLATE 5 MG PO TABS: 5.0000 mg | ORAL_TABLET | Freq: Every day | ORAL | 0 refills | Status: DC

## 2024-04-06 MED ORDER — IBUPROFEN 800 MG PO TABS: 800.0000 mg | ORAL_TABLET | Freq: Three times a day (TID) | ORAL | 0 refills | Status: AC | PRN

## 2024-04-06 MED ORDER — AMLODIPINE BESYLATE 5 MG PO TABS: 5.0000 mg | ORAL_TABLET | Freq: Every day | ORAL | 0 refills | Status: AC

## 2024-04-06 MED ORDER — CEFTRIAXONE SODIUM 1 G IJ SOLR
1.0000 g | Freq: Once | INTRAMUSCULAR | Status: AC
  Administered 2024-04-06: 1 g via INTRAMUSCULAR

## 2024-04-06 MED ORDER — SULFAMETHOXAZOLE-TRIMETHOPRIM 800-160 MG PO TABS: 1.0000 | ORAL_TABLET | Freq: Two times a day (BID) | ORAL | 0 refills | Status: AC

## 2024-04-06 NOTE — Discharge Instructions (Addendum)
 You were seen today for an infected wound on your left upper thigh. The exam showed redness, swelling, warmth, and drainage from the wound, but no abscess or signs that the infection has spread to the rest of your body. You received an antibiotic injection in the clinic and were prescribed an oral antibiotic (Bactrim) to take at home. A sample from your wound was sent for testing to confirm the type of bacteria and to make sure the antibiotic will work. At home, wash the wound gently with mild soap (like Dial) and warm water, pat it dry, and cover it with a clean dressing each time. Do not apply ointments, creams, or home remedies to the wound, and do not squeeze or mash it. Applying a warm, moist compress to the area several times a day will help it continue to drain on its own. A circle was drawn around the redness on your skin to help track whether the infection is improving or spreading. You should start to feel better in the next two to three days. If the redness spreads beyond the marked line, the swelling or pain worsens, or you develop fever, chills, or other new symptoms, go to the hospital immediately.  Your blood pressure was also higher than normal today. A new prescription for Norvasc  has been started, and you should take one tablet every day. High blood pressure usually does not cause symptoms, but it can lead to serious problems such as stroke or heart attack if left untreated. It is very important to take your medication every day and to follow up with a primary care provider for long-term management and monitoring.

## 2024-04-06 NOTE — ED Triage Notes (Addendum)
 Pt reports a spider or mosquito bite to L anterior thigh x6 days. Pt reports applying vicks vapor rub and salt to the area. Bite has opened up forming a wound with redness, swelling, and purulent drainage. Pt reports consistent dull to sharp pain in L leg. Denies fevers.

## 2024-04-06 NOTE — ED Provider Notes (Signed)
 [facility]    CSN: [id number] Arrival date & time: 04/06/24  1456      History   Chief Complaint Chief Complaint  Patient presents with   Insect Bite    HPI Cris Talavera is a 53 y.o. male.   Discussed the use of AI scribe software for clinical note transcription with the patient, who gave verbal consent to proceed.   The patient presents with a bite wound to the upper left thigh that occurred approximately one week ago, possibly from a spider. The wound has developed redness, swelling, drainage, and pain. The patient denies any systemic symptoms including fevers, chills, body aches, nausea, or vomiting. The patient is not diabetic.  The following sections of the patient's history were reviewed and updated as appropriate: allergies, current medications, past family history, past medical history, past social history, past surgical history, and problem list.       Past Medical History:  Diagnosis Date   Hypertension    Substance abuse [facility])     Patient Active Problem List   Diagnosis Date Noted   Acute respiratory failure with hypoxia and hypercapnia (HCC)    ARDS (adult respiratory distress syndrome) (HCC) 09/24/2017   Heroin overdose (HCC) 08/29/2017   Respiratory arrest (HCC) 08/29/2017   Aspiration pneumonia (HCC) 08/29/2017   Pulmonary nodule 08/29/2017   Acute kidney injury 08/29/2017   Heroin overdose, accidental or unintentional, initial encounter (HCC) 08/29/2017    History reviewed. No pertinent surgical history.     Home Medications    Prior to Admission medications   Medication Sig Start Date End Date Taking? Authorizing Provider  ibuprofen (ADVIL) 800 MG tablet Take 1 tablet (800 mg total) by mouth every 8 (eight) hours as needed (pain). Do not take any additional over-the-counter NSAIDs (such as Advil, Aleve, or other ibuprofen/naproxen products) while using this medication. 04/06/24  Yes Daryn Hicks, FNP   sulfamethoxazole-trimethoprim (BACTRIM DS) 800-160 MG tablet Take 1 tablet by mouth 2 (two) times daily for 7 days. 04/06/24 04/13/24 Yes Iola Lukes, FNP  amLODipine  (NORVASC ) 5 MG tablet Take 1 tablet (5 mg total) by mouth daily. 04/06/24 05/06/24  Iola Lukes, FNP    Family History Family History  Family history unknown: Yes    Social History Social History   Tobacco Use   Smoking status: Every Day    Current packs/day: 1.00    Types: Cigarettes   Smokeless tobacco: Never  Vaping Use   Vaping status: Never Used  Substance Use Topics   Alcohol use: No   Drug use: Not Currently    Types: Benzodiazepines, Heroin, Oxycodone     Allergies   Patient has no known allergies.   Review of Systems Review of Systems  Constitutional:  Negative for chills and fever.  Eyes:  Negative for visual disturbance.  Respiratory:  Negative for chest tightness and shortness of breath.   Cardiovascular:  Negative for chest pain, palpitations and leg swelling.  Gastrointestinal:  Negative for nausea and vomiting.  Musculoskeletal:  Negative for myalgias.  Skin:  Positive for wound.  Neurological:  Negative for dizziness, numbness and headaches.  All other systems reviewed and are negative.    Physical Exam Triage Vital Signs ED Triage Vitals  Encounter Vitals Group     BP 04/06/24 1537 (!) 163/100     Girls Systolic BP Percentile --      Girls Diastolic BP Percentile --      Boys Systolic BP Percentile --  Boys Diastolic BP Percentile --      Pulse Rate 04/06/24 1537 89     Resp 04/06/24 1537 14     Temp 04/06/24 1537 98.1 F (36.7 C)     Temp Source 04/06/24 1537 Oral     SpO2 04/06/24 1537 96 %     Weight --      Height --      Head Circumference --      Peak Flow --      Pain Score 04/06/24 1538 7     Pain Loc --      Pain Education --      Exclude from Growth Chart --    No data found.  Updated Vital Signs BP (!) 149/82 (BP Location: Left Arm)    Pulse 89   Temp 98.1 F (36.7 C) (Oral)   Resp 14   SpO2 96%   Visual Acuity Right Eye Distance:   Left Eye Distance:   Bilateral Distance:    Right Eye Near:   Left Eye Near:    Bilateral Near:     Physical Exam Vitals reviewed.  Constitutional:      General: He is awake. He is not in acute distress.    Appearance: Normal appearance. He is well-developed. He is not ill-appearing, toxic-appearing or diaphoretic.  HENT:     Head: Normocephalic.     Right Ear: Hearing normal.     Left Ear: Hearing normal.     Nose: Nose normal.     Mouth/Throat:     Mouth: Mucous membranes are moist.  Eyes:     General: Vision grossly intact.     Conjunctiva/sclera: Conjunctivae normal.  Cardiovascular:     Rate and Rhythm: Normal rate and regular rhythm.     Heart sounds: Normal heart sounds.  Pulmonary:     Effort: Pulmonary effort is normal.     Breath sounds: Normal breath sounds and air entry.  Musculoskeletal:        General: Normal range of motion.     Cervical back: Normal range of motion and neck supple.  Skin:    General: Skin is warm and dry.     Findings: Wound present.     Comments: There is a 2 x 2 cm rounded wound present on the anterior aspect of the left upper thigh. A scant amount of purulent drainage was manually expressed, with no spontaneous drainage observed. Surrounding erythema measures 11 x 8 cm, and the wound with adjacent tissue is tender and warm to the touch. The area of erythema was outlined for monitoring. See image below.  Neurological:     General: No focal deficit present.     Mental Status: He is alert and oriented to person, place, and time.  Psychiatric:        Speech: Speech normal.        Behavior: Behavior is cooperative.      UC Treatments / Results  Labs (all labs ordered are listed, but only abnormal results are displayed) Labs Reviewed  AEROBIC CULTURE W GRAM STAIN (SUPERFICIAL SPECIMEN)    EKG   Radiology DG Femur Min 2 Views  Left Result Date: 04/04/2024 CLINICAL DATA:  Left anterior thigh wound with open drainage, initial encounter EXAM: LEFT FEMUR 2 VIEWS COMPARISON:  None Available. FINDINGS: No acute fracture or dislocation is noted. Mild vascular calcifications are seen. Soft tissue swelling is noted although the anterior wound is not well visualized. No bony erosive changes are  seen. IMPRESSION: Soft tissue swelling without acute bony abnormality. Electronically Signed   By: Oneil Devonshire M.D.   On: 04/04/2024 20:52    Procedures Procedures (including critical care time)  Medications Ordered in UC Medications  cefTRIAXone  (ROCEPHIN ) injection 1 g (1 g Intramuscular Given 04/06/24 1650)    Initial Impression / Assessment and Plan / UC Course  I have reviewed the triage vital signs and the nursing notes.  Pertinent labs & imaging results that were available during my care of the patient were reviewed by me and considered in my medical decision making (see chart for details).     The patient presents with a wound on the anterior aspect of the left upper thigh following a suspected spider bite approximately one week ago. Examination reveals wound with surrounding erythema. Warmth, swelling, and purulent drainage present without fluctuance or systemic symptoms. The findings are consistent with localized wound infection and cellulitis. Rocephin  was administered intramuscularly in clinic, and oral Bactrim DS was prescribed. A wound culture was obtained to identify the causative organism and guide therapy if needed. The patient was instructed on wound care, including gentle cleansing with mild soap and water, keeping the area covered with a clean dressing, avoiding ointments or manipulation, and applying warm compresses several times daily to encourage spontaneous drainage. The area of erythema was outlined for monitoring, and the patient was advised to expect improvement within two to three days and to seek immediate care  if redness spreads beyond the marked area or symptoms worsen.  The patient's blood pressure was elevated at 163/100 mmHg, with repeat measurement at 149/82 mmHg. History revealed a prior prescription for antihypertensive medication that has not been taken since 2019. Norvasc  5 mg daily was prescribed, and the patient was counseled on the importance of adherence to blood pressure management to prevent complications such as stroke. Referral to a primary care provider was recommended for ongoing monitoring and adjustment of therapy.  Today's evaluation has revealed no signs of a dangerous process. Discussed diagnosis with patient and/or guardian. Patient and/or guardian aware of their diagnosis, possible red flag symptoms to watch out for and need for close follow up. Patient and/or guardian understands verbal and written discharge instructions. Patient and/or guardian comfortable with plan and disposition.  Patient and/or guardian has a clear mental status at this time, good insight into illness (after discussion and teaching) and has clear judgment to make decisions regarding their care  Documentation was completed with the aid of voice recognition software. Transcription may contain typographical errors.    Final Clinical Impressions(s) / UC Diagnoses   Final diagnoses:  Wound infection  Elevated blood pressure reading with diagnosis of hypertension     Discharge Instructions      You were seen today for an infected wound on your left upper thigh. The exam showed redness, swelling, warmth, and drainage from the wound, but no abscess or signs that the infection has spread to the rest of your body. You received an antibiotic injection in the clinic and were prescribed an oral antibiotic (Bactrim) to take at home. A sample from your wound was sent for testing to confirm the type of bacteria and to make sure the antibiotic will work. At home, wash the wound gently with mild soap (like Dial) and warm  water, pat it dry, and cover it with a clean dressing each time. Do not apply ointments, creams, or home remedies to the wound, and do not squeeze or mash it. Applying a warm,  moist compress to the area several times a day will help it continue to drain on its own. A circle was drawn around the redness on your skin to help track whether the infection is improving or spreading. You should start to feel better in the next two to three days. If the redness spreads beyond the marked line, the swelling or pain worsens, or you develop fever, chills, or other new symptoms, go to the hospital immediately.  Your blood pressure was also higher than normal today. A new prescription for Norvasc  has been started, and you should take one tablet every day. High blood pressure usually does not cause symptoms, but it can lead to serious problems such as stroke or heart attack if left untreated. It is very important to take your medication every day and to follow up with a primary care provider for long-term management and monitoring.     ED Prescriptions     Medication Sig Dispense Auth. Provider   amLODipine  (NORVASC ) 5 MG tablet  (Status: Discontinued) Take 1 tablet (5 mg total) by mouth daily. 30 tablet Iola Lukes, FNP   sulfamethoxazole-trimethoprim (BACTRIM DS) 800-160 MG tablet Take 1 tablet by mouth 2 (two) times daily for 7 days. 14 tablet Foy Mungia, [place], FNP   ibuprofen (ADVIL) 800 MG tablet Take 1 tablet (800 mg total) by mouth every 8 (eight) hours as needed (pain). Do not take any additional over-the-counter NSAIDs (such as Advil, Aleve, or other ibuprofen/naproxen products) while using this medication. 21 tablet Iola Lukes, FNP   amLODipine  (NORVASC ) 5 MG tablet Take 1 tablet (5 mg total) by mouth daily. 60 tablet Iola Lukes, FNP      PDMP not reviewed this encounter.   Iola Lukes, [place] 04/06/24 1723

## 2024-04-09 LAB — CULTURE, BLOOD (ROUTINE X 2)
Culture: NO GROWTH
Culture: NO GROWTH
Special Requests: ADEQUATE
Special Requests: ADEQUATE

## 2024-04-09 LAB — AEROBIC CULTURE W GRAM STAIN (SUPERFICIAL SPECIMEN)

## 2024-04-11 ENCOUNTER — Ambulatory Visit: Payer: Self-pay | Admitting: Nurse Practitioner

## 2024-04-11 MED ORDER — CLINDAMYCIN HCL 300 MG PO CAPS: 300.0000 mg | ORAL_CAPSULE | Freq: Three times a day (TID) | ORAL | 0 refills | Status: AC

## 2024-04-11 NOTE — Progress Notes (Signed)
 Wound culture results reviewed and positive for MRSA; resistant to the prescribed Bactrim. Discontinue Bactrim and start Clindamycin 300 mg PO BID for 7 days. Patient should continue local wound care and monitor closely for any signs of worsening infection, such as increased redness, swelling, drainage, or fever. Follow-up as needed.

## 2024-04-11 NOTE — Telephone Encounter (Signed)
 Correction: Clindamycin dosage should be 300 mg PO TID (not BID) as indicated in the previous note. The medication order to the pharmacy reflects the correct dosing.

## 2024-05-23 ENCOUNTER — Telehealth: Payer: Self-pay | Admitting: General Practice

## 2024-05-23 ENCOUNTER — Ambulatory Visit: Payer: Self-pay

## 2024-05-23 NOTE — Telephone Encounter (Signed)
 Called pt and left vm to call office back to reschedule missed appt
